# Patient Record
Sex: Male | Born: 1940 | Race: White | Hispanic: No | Marital: Married | Smoking: Never smoker
Health system: Southern US, Community
[De-identification: ages and names within clinical notes are randomized; demographics above are authoritative.]

## PROBLEM LIST (undated history)

## (undated) DIAGNOSIS — K219 Gastro-esophageal reflux disease without esophagitis: Secondary | ICD-10-CM

## (undated) DIAGNOSIS — F419 Anxiety disorder, unspecified: Secondary | ICD-10-CM

## (undated) DIAGNOSIS — E785 Hyperlipidemia, unspecified: Secondary | ICD-10-CM

---

## 2011-07-07 DIAGNOSIS — M5380 Other specified dorsopathies, site unspecified: Secondary | ICD-10-CM | POA: Diagnosis not present

## 2011-07-07 DIAGNOSIS — F411 Generalized anxiety disorder: Secondary | ICD-10-CM | POA: Diagnosis not present

## 2011-07-07 DIAGNOSIS — M47817 Spondylosis without myelopathy or radiculopathy, lumbosacral region: Secondary | ICD-10-CM | POA: Diagnosis not present

## 2011-07-07 DIAGNOSIS — Z79899 Other long term (current) drug therapy: Secondary | ICD-10-CM | POA: Diagnosis not present

## 2011-07-07 DIAGNOSIS — G8929 Other chronic pain: Secondary | ICD-10-CM | POA: Diagnosis not present

## 2011-07-07 DIAGNOSIS — E785 Hyperlipidemia, unspecified: Secondary | ICD-10-CM | POA: Diagnosis not present

## 2011-07-22 DIAGNOSIS — M47817 Spondylosis without myelopathy or radiculopathy, lumbosacral region: Secondary | ICD-10-CM | POA: Diagnosis not present

## 2011-07-22 DIAGNOSIS — M461 Sacroiliitis, not elsewhere classified: Secondary | ICD-10-CM | POA: Diagnosis not present

## 2011-07-22 DIAGNOSIS — M5380 Other specified dorsopathies, site unspecified: Secondary | ICD-10-CM | POA: Diagnosis not present

## 2011-07-22 DIAGNOSIS — M1990 Unspecified osteoarthritis, unspecified site: Secondary | ICD-10-CM | POA: Diagnosis not present

## 2011-07-30 DIAGNOSIS — F411 Generalized anxiety disorder: Secondary | ICD-10-CM | POA: Diagnosis not present

## 2011-07-30 DIAGNOSIS — M5380 Other specified dorsopathies, site unspecified: Secondary | ICD-10-CM | POA: Diagnosis not present

## 2011-07-30 DIAGNOSIS — M2559 Pain in other specified joint: Secondary | ICD-10-CM | POA: Diagnosis not present

## 2011-07-30 DIAGNOSIS — Z79899 Other long term (current) drug therapy: Secondary | ICD-10-CM | POA: Diagnosis not present

## 2011-07-30 DIAGNOSIS — M47817 Spondylosis without myelopathy or radiculopathy, lumbosacral region: Secondary | ICD-10-CM | POA: Diagnosis not present

## 2011-07-30 DIAGNOSIS — M1990 Unspecified osteoarthritis, unspecified site: Secondary | ICD-10-CM | POA: Diagnosis not present

## 2011-07-30 DIAGNOSIS — E785 Hyperlipidemia, unspecified: Secondary | ICD-10-CM | POA: Diagnosis not present

## 2011-07-30 DIAGNOSIS — M461 Sacroiliitis, not elsewhere classified: Secondary | ICD-10-CM | POA: Diagnosis not present

## 2011-08-21 DIAGNOSIS — M47817 Spondylosis without myelopathy or radiculopathy, lumbosacral region: Secondary | ICD-10-CM | POA: Diagnosis not present

## 2011-08-21 DIAGNOSIS — E785 Hyperlipidemia, unspecified: Secondary | ICD-10-CM | POA: Diagnosis not present

## 2011-08-21 DIAGNOSIS — Z79899 Other long term (current) drug therapy: Secondary | ICD-10-CM | POA: Diagnosis not present

## 2011-08-21 DIAGNOSIS — M5380 Other specified dorsopathies, site unspecified: Secondary | ICD-10-CM | POA: Diagnosis not present

## 2011-08-21 DIAGNOSIS — M461 Sacroiliitis, not elsewhere classified: Secondary | ICD-10-CM | POA: Diagnosis not present

## 2011-08-21 DIAGNOSIS — F411 Generalized anxiety disorder: Secondary | ICD-10-CM | POA: Diagnosis not present

## 2011-08-21 DIAGNOSIS — M1990 Unspecified osteoarthritis, unspecified site: Secondary | ICD-10-CM | POA: Diagnosis not present

## 2011-08-21 DIAGNOSIS — G8929 Other chronic pain: Secondary | ICD-10-CM | POA: Diagnosis not present

## 2011-08-31 DIAGNOSIS — F411 Generalized anxiety disorder: Secondary | ICD-10-CM | POA: Diagnosis not present

## 2011-09-10 DIAGNOSIS — M899 Disorder of bone, unspecified: Secondary | ICD-10-CM | POA: Diagnosis not present

## 2011-09-10 DIAGNOSIS — IMO0002 Reserved for concepts with insufficient information to code with codable children: Secondary | ICD-10-CM | POA: Diagnosis not present

## 2011-09-10 DIAGNOSIS — M25519 Pain in unspecified shoulder: Secondary | ICD-10-CM | POA: Diagnosis not present

## 2011-09-11 DIAGNOSIS — M1990 Unspecified osteoarthritis, unspecified site: Secondary | ICD-10-CM | POA: Diagnosis not present

## 2011-09-11 DIAGNOSIS — E7800 Pure hypercholesterolemia, unspecified: Secondary | ICD-10-CM | POA: Diagnosis not present

## 2011-09-14 DIAGNOSIS — M1990 Unspecified osteoarthritis, unspecified site: Secondary | ICD-10-CM | POA: Diagnosis not present

## 2011-09-14 DIAGNOSIS — M47817 Spondylosis without myelopathy or radiculopathy, lumbosacral region: Secondary | ICD-10-CM | POA: Diagnosis not present

## 2011-09-14 DIAGNOSIS — M5380 Other specified dorsopathies, site unspecified: Secondary | ICD-10-CM | POA: Diagnosis not present

## 2011-09-14 DIAGNOSIS — Z79899 Other long term (current) drug therapy: Secondary | ICD-10-CM | POA: Diagnosis not present

## 2011-09-17 DIAGNOSIS — M5137 Other intervertebral disc degeneration, lumbosacral region: Secondary | ICD-10-CM | POA: Diagnosis not present

## 2011-09-17 DIAGNOSIS — M47817 Spondylosis without myelopathy or radiculopathy, lumbosacral region: Secondary | ICD-10-CM | POA: Diagnosis not present

## 2011-09-17 DIAGNOSIS — M5126 Other intervertebral disc displacement, lumbar region: Secondary | ICD-10-CM | POA: Diagnosis not present

## 2011-10-05 DIAGNOSIS — F411 Generalized anxiety disorder: Secondary | ICD-10-CM | POA: Diagnosis not present

## 2011-10-05 DIAGNOSIS — K5730 Diverticulosis of large intestine without perforation or abscess without bleeding: Secondary | ICD-10-CM | POA: Diagnosis not present

## 2011-10-05 DIAGNOSIS — K219 Gastro-esophageal reflux disease without esophagitis: Secondary | ICD-10-CM | POA: Diagnosis not present

## 2011-10-05 DIAGNOSIS — F329 Major depressive disorder, single episode, unspecified: Secondary | ICD-10-CM | POA: Diagnosis not present

## 2011-10-05 DIAGNOSIS — Z1211 Encounter for screening for malignant neoplasm of colon: Secondary | ICD-10-CM | POA: Diagnosis not present

## 2011-10-05 DIAGNOSIS — D126 Benign neoplasm of colon, unspecified: Secondary | ICD-10-CM | POA: Diagnosis not present

## 2011-10-05 DIAGNOSIS — Z79899 Other long term (current) drug therapy: Secondary | ICD-10-CM | POA: Diagnosis not present

## 2011-10-05 DIAGNOSIS — K621 Rectal polyp: Secondary | ICD-10-CM | POA: Diagnosis not present

## 2011-10-05 DIAGNOSIS — E785 Hyperlipidemia, unspecified: Secondary | ICD-10-CM | POA: Diagnosis not present

## 2011-10-21 DIAGNOSIS — M47817 Spondylosis without myelopathy or radiculopathy, lumbosacral region: Secondary | ICD-10-CM | POA: Diagnosis not present

## 2011-10-21 DIAGNOSIS — M1990 Unspecified osteoarthritis, unspecified site: Secondary | ICD-10-CM | POA: Diagnosis not present

## 2011-10-21 DIAGNOSIS — M5380 Other specified dorsopathies, site unspecified: Secondary | ICD-10-CM | POA: Diagnosis not present

## 2011-10-21 DIAGNOSIS — Z79899 Other long term (current) drug therapy: Secondary | ICD-10-CM | POA: Diagnosis not present

## 2011-10-28 DIAGNOSIS — S70359A Superficial foreign body, unspecified thigh, initial encounter: Secondary | ICD-10-CM | POA: Diagnosis not present

## 2011-10-28 DIAGNOSIS — S90569A Insect bite (nonvenomous), unspecified ankle, initial encounter: Secondary | ICD-10-CM | POA: Diagnosis not present

## 2011-10-28 DIAGNOSIS — S90559A Superficial foreign body, unspecified ankle, initial encounter: Secondary | ICD-10-CM | POA: Diagnosis not present

## 2011-10-28 DIAGNOSIS — S80859A Superficial foreign body, unspecified lower leg, initial encounter: Secondary | ICD-10-CM | POA: Diagnosis not present

## 2011-10-28 DIAGNOSIS — S70259A Superficial foreign body, unspecified hip, initial encounter: Secondary | ICD-10-CM | POA: Diagnosis not present

## 2011-10-28 DIAGNOSIS — Z79899 Other long term (current) drug therapy: Secondary | ICD-10-CM | POA: Diagnosis not present

## 2011-12-10 DIAGNOSIS — IMO0001 Reserved for inherently not codable concepts without codable children: Secondary | ICD-10-CM | POA: Diagnosis not present

## 2011-12-10 DIAGNOSIS — M549 Dorsalgia, unspecified: Secondary | ICD-10-CM | POA: Diagnosis not present

## 2011-12-10 DIAGNOSIS — G8929 Other chronic pain: Secondary | ICD-10-CM | POA: Diagnosis not present

## 2011-12-10 DIAGNOSIS — M1990 Unspecified osteoarthritis, unspecified site: Secondary | ICD-10-CM | POA: Diagnosis not present

## 2011-12-10 DIAGNOSIS — E785 Hyperlipidemia, unspecified: Secondary | ICD-10-CM | POA: Diagnosis not present

## 2011-12-10 DIAGNOSIS — F411 Generalized anxiety disorder: Secondary | ICD-10-CM | POA: Diagnosis not present

## 2011-12-10 DIAGNOSIS — E7800 Pure hypercholesterolemia, unspecified: Secondary | ICD-10-CM | POA: Diagnosis not present

## 2011-12-14 DIAGNOSIS — I10 Essential (primary) hypertension: Secondary | ICD-10-CM | POA: Diagnosis not present

## 2011-12-14 DIAGNOSIS — E7800 Pure hypercholesterolemia, unspecified: Secondary | ICD-10-CM | POA: Diagnosis not present

## 2012-01-05 DIAGNOSIS — F411 Generalized anxiety disorder: Secondary | ICD-10-CM | POA: Diagnosis not present

## 2012-01-20 DIAGNOSIS — M5380 Other specified dorsopathies, site unspecified: Secondary | ICD-10-CM | POA: Diagnosis not present

## 2012-01-20 DIAGNOSIS — M1990 Unspecified osteoarthritis, unspecified site: Secondary | ICD-10-CM | POA: Diagnosis not present

## 2012-01-20 DIAGNOSIS — Z79899 Other long term (current) drug therapy: Secondary | ICD-10-CM | POA: Diagnosis not present

## 2012-01-20 DIAGNOSIS — M47817 Spondylosis without myelopathy or radiculopathy, lumbosacral region: Secondary | ICD-10-CM | POA: Diagnosis not present

## 2012-03-17 DIAGNOSIS — R109 Unspecified abdominal pain: Secondary | ICD-10-CM | POA: Diagnosis not present

## 2012-03-17 DIAGNOSIS — M549 Dorsalgia, unspecified: Secondary | ICD-10-CM | POA: Diagnosis not present

## 2012-04-20 DIAGNOSIS — M47817 Spondylosis without myelopathy or radiculopathy, lumbosacral region: Secondary | ICD-10-CM | POA: Diagnosis not present

## 2012-04-20 DIAGNOSIS — M1990 Unspecified osteoarthritis, unspecified site: Secondary | ICD-10-CM | POA: Diagnosis not present

## 2012-04-20 DIAGNOSIS — Z79899 Other long term (current) drug therapy: Secondary | ICD-10-CM | POA: Diagnosis not present

## 2012-04-20 DIAGNOSIS — M5380 Other specified dorsopathies, site unspecified: Secondary | ICD-10-CM | POA: Diagnosis not present

## 2012-05-06 DIAGNOSIS — G8929 Other chronic pain: Secondary | ICD-10-CM | POA: Diagnosis not present

## 2012-05-06 DIAGNOSIS — K219 Gastro-esophageal reflux disease without esophagitis: Secondary | ICD-10-CM | POA: Diagnosis not present

## 2012-05-06 DIAGNOSIS — F411 Generalized anxiety disorder: Secondary | ICD-10-CM | POA: Diagnosis not present

## 2012-05-06 DIAGNOSIS — Z886 Allergy status to analgesic agent status: Secondary | ICD-10-CM | POA: Diagnosis not present

## 2012-05-06 DIAGNOSIS — M1990 Unspecified osteoarthritis, unspecified site: Secondary | ICD-10-CM | POA: Diagnosis not present

## 2012-05-06 DIAGNOSIS — M5137 Other intervertebral disc degeneration, lumbosacral region: Secondary | ICD-10-CM | POA: Diagnosis not present

## 2012-05-06 DIAGNOSIS — E785 Hyperlipidemia, unspecified: Secondary | ICD-10-CM | POA: Diagnosis not present

## 2012-05-06 DIAGNOSIS — F17200 Nicotine dependence, unspecified, uncomplicated: Secondary | ICD-10-CM | POA: Diagnosis not present

## 2012-05-06 DIAGNOSIS — M461 Sacroiliitis, not elsewhere classified: Secondary | ICD-10-CM | POA: Diagnosis not present

## 2012-05-06 DIAGNOSIS — Z79899 Other long term (current) drug therapy: Secondary | ICD-10-CM | POA: Diagnosis not present

## 2012-05-06 DIAGNOSIS — Z8614 Personal history of Methicillin resistant Staphylococcus aureus infection: Secondary | ICD-10-CM | POA: Diagnosis not present

## 2012-05-06 DIAGNOSIS — Z882 Allergy status to sulfonamides status: Secondary | ICD-10-CM | POA: Diagnosis not present

## 2012-06-06 DIAGNOSIS — M5380 Other specified dorsopathies, site unspecified: Secondary | ICD-10-CM | POA: Diagnosis not present

## 2012-06-06 DIAGNOSIS — M47817 Spondylosis without myelopathy or radiculopathy, lumbosacral region: Secondary | ICD-10-CM | POA: Diagnosis not present

## 2012-06-06 DIAGNOSIS — Z79899 Other long term (current) drug therapy: Secondary | ICD-10-CM | POA: Diagnosis not present

## 2012-06-06 DIAGNOSIS — M1990 Unspecified osteoarthritis, unspecified site: Secondary | ICD-10-CM | POA: Diagnosis not present

## 2012-06-16 DIAGNOSIS — G8929 Other chronic pain: Secondary | ICD-10-CM | POA: Diagnosis not present

## 2012-06-16 DIAGNOSIS — F410 Panic disorder [episodic paroxysmal anxiety] without agoraphobia: Secondary | ICD-10-CM | POA: Diagnosis not present

## 2012-06-16 DIAGNOSIS — E7800 Pure hypercholesterolemia, unspecified: Secondary | ICD-10-CM | POA: Diagnosis not present

## 2012-06-24 DIAGNOSIS — Z5181 Encounter for therapeutic drug level monitoring: Secondary | ICD-10-CM | POA: Diagnosis not present

## 2012-06-24 DIAGNOSIS — Z79899 Other long term (current) drug therapy: Secondary | ICD-10-CM | POA: Diagnosis not present

## 2012-07-12 DIAGNOSIS — E7800 Pure hypercholesterolemia, unspecified: Secondary | ICD-10-CM | POA: Diagnosis not present

## 2012-07-12 DIAGNOSIS — R7309 Other abnormal glucose: Secondary | ICD-10-CM | POA: Diagnosis not present

## 2012-07-12 DIAGNOSIS — Z125 Encounter for screening for malignant neoplasm of prostate: Secondary | ICD-10-CM | POA: Diagnosis not present

## 2012-08-12 DIAGNOSIS — H2510 Age-related nuclear cataract, unspecified eye: Secondary | ICD-10-CM | POA: Diagnosis not present

## 2012-08-17 DIAGNOSIS — F411 Generalized anxiety disorder: Secondary | ICD-10-CM | POA: Diagnosis not present

## 2012-08-18 DIAGNOSIS — E7800 Pure hypercholesterolemia, unspecified: Secondary | ICD-10-CM | POA: Diagnosis not present

## 2012-08-18 DIAGNOSIS — F410 Panic disorder [episodic paroxysmal anxiety] without agoraphobia: Secondary | ICD-10-CM | POA: Diagnosis not present

## 2012-09-08 DIAGNOSIS — M5380 Other specified dorsopathies, site unspecified: Secondary | ICD-10-CM | POA: Diagnosis not present

## 2012-09-08 DIAGNOSIS — M25519 Pain in unspecified shoulder: Secondary | ICD-10-CM | POA: Diagnosis not present

## 2012-09-08 DIAGNOSIS — M1990 Unspecified osteoarthritis, unspecified site: Secondary | ICD-10-CM | POA: Diagnosis not present

## 2012-09-08 DIAGNOSIS — M47817 Spondylosis without myelopathy or radiculopathy, lumbosacral region: Secondary | ICD-10-CM | POA: Diagnosis not present

## 2012-09-23 DIAGNOSIS — M461 Sacroiliitis, not elsewhere classified: Secondary | ICD-10-CM | POA: Diagnosis not present

## 2012-09-23 DIAGNOSIS — M5380 Other specified dorsopathies, site unspecified: Secondary | ICD-10-CM | POA: Diagnosis not present

## 2012-09-23 DIAGNOSIS — M5137 Other intervertebral disc degeneration, lumbosacral region: Secondary | ICD-10-CM | POA: Diagnosis not present

## 2012-09-23 DIAGNOSIS — Z79899 Other long term (current) drug therapy: Secondary | ICD-10-CM | POA: Diagnosis not present

## 2012-09-23 DIAGNOSIS — Z886 Allergy status to analgesic agent status: Secondary | ICD-10-CM | POA: Diagnosis not present

## 2012-09-23 DIAGNOSIS — F411 Generalized anxiety disorder: Secondary | ICD-10-CM | POA: Diagnosis not present

## 2012-09-23 DIAGNOSIS — G8929 Other chronic pain: Secondary | ICD-10-CM | POA: Diagnosis not present

## 2012-09-23 DIAGNOSIS — M1990 Unspecified osteoarthritis, unspecified site: Secondary | ICD-10-CM | POA: Diagnosis not present

## 2012-09-23 DIAGNOSIS — E785 Hyperlipidemia, unspecified: Secondary | ICD-10-CM | POA: Diagnosis not present

## 2012-09-23 DIAGNOSIS — M25519 Pain in unspecified shoulder: Secondary | ICD-10-CM | POA: Diagnosis not present

## 2012-09-23 DIAGNOSIS — K219 Gastro-esophageal reflux disease without esophagitis: Secondary | ICD-10-CM | POA: Diagnosis not present

## 2012-09-23 DIAGNOSIS — IMO0002 Reserved for concepts with insufficient information to code with codable children: Secondary | ICD-10-CM | POA: Diagnosis not present

## 2012-09-23 DIAGNOSIS — M47817 Spondylosis without myelopathy or radiculopathy, lumbosacral region: Secondary | ICD-10-CM | POA: Diagnosis not present

## 2012-09-23 DIAGNOSIS — Z882 Allergy status to sulfonamides status: Secondary | ICD-10-CM | POA: Diagnosis not present

## 2012-10-28 DIAGNOSIS — F411 Generalized anxiety disorder: Secondary | ICD-10-CM | POA: Diagnosis not present

## 2012-11-02 DIAGNOSIS — M5380 Other specified dorsopathies, site unspecified: Secondary | ICD-10-CM | POA: Diagnosis not present

## 2012-11-02 DIAGNOSIS — Z79899 Other long term (current) drug therapy: Secondary | ICD-10-CM | POA: Diagnosis not present

## 2012-11-02 DIAGNOSIS — M47817 Spondylosis without myelopathy or radiculopathy, lumbosacral region: Secondary | ICD-10-CM | POA: Diagnosis not present

## 2012-11-02 DIAGNOSIS — M1990 Unspecified osteoarthritis, unspecified site: Secondary | ICD-10-CM | POA: Diagnosis not present

## 2012-11-24 DIAGNOSIS — I10 Essential (primary) hypertension: Secondary | ICD-10-CM | POA: Diagnosis not present

## 2012-12-09 DIAGNOSIS — F411 Generalized anxiety disorder: Secondary | ICD-10-CM | POA: Diagnosis not present

## 2012-12-09 DIAGNOSIS — I10 Essential (primary) hypertension: Secondary | ICD-10-CM | POA: Diagnosis not present

## 2012-12-09 DIAGNOSIS — E785 Hyperlipidemia, unspecified: Secondary | ICD-10-CM | POA: Diagnosis not present

## 2012-12-09 DIAGNOSIS — Z125 Encounter for screening for malignant neoplasm of prostate: Secondary | ICD-10-CM | POA: Diagnosis not present

## 2013-01-12 DIAGNOSIS — F410 Panic disorder [episodic paroxysmal anxiety] without agoraphobia: Secondary | ICD-10-CM | POA: Diagnosis not present

## 2013-02-10 DIAGNOSIS — H2510 Age-related nuclear cataract, unspecified eye: Secondary | ICD-10-CM | POA: Diagnosis not present

## 2013-03-01 DIAGNOSIS — M1990 Unspecified osteoarthritis, unspecified site: Secondary | ICD-10-CM | POA: Diagnosis not present

## 2013-03-01 DIAGNOSIS — M47817 Spondylosis without myelopathy or radiculopathy, lumbosacral region: Secondary | ICD-10-CM | POA: Diagnosis not present

## 2013-03-01 DIAGNOSIS — Z79899 Other long term (current) drug therapy: Secondary | ICD-10-CM | POA: Diagnosis not present

## 2013-03-01 DIAGNOSIS — M5380 Other specified dorsopathies, site unspecified: Secondary | ICD-10-CM | POA: Diagnosis not present

## 2013-04-06 DIAGNOSIS — F410 Panic disorder [episodic paroxysmal anxiety] without agoraphobia: Secondary | ICD-10-CM | POA: Diagnosis not present

## 2013-04-06 DIAGNOSIS — F458 Other somatoform disorders: Secondary | ICD-10-CM | POA: Diagnosis not present

## 2013-05-03 DIAGNOSIS — M549 Dorsalgia, unspecified: Secondary | ICD-10-CM | POA: Diagnosis not present

## 2013-05-03 DIAGNOSIS — E7800 Pure hypercholesterolemia, unspecified: Secondary | ICD-10-CM | POA: Diagnosis not present

## 2013-06-12 DIAGNOSIS — I10 Essential (primary) hypertension: Secondary | ICD-10-CM | POA: Diagnosis not present

## 2013-06-12 DIAGNOSIS — M549 Dorsalgia, unspecified: Secondary | ICD-10-CM | POA: Diagnosis not present

## 2013-06-12 DIAGNOSIS — E7800 Pure hypercholesterolemia, unspecified: Secondary | ICD-10-CM | POA: Diagnosis not present

## 2013-06-20 DIAGNOSIS — M47817 Spondylosis without myelopathy or radiculopathy, lumbosacral region: Secondary | ICD-10-CM | POA: Diagnosis not present

## 2013-06-20 DIAGNOSIS — M1990 Unspecified osteoarthritis, unspecified site: Secondary | ICD-10-CM | POA: Diagnosis not present

## 2013-06-20 DIAGNOSIS — M5380 Other specified dorsopathies, site unspecified: Secondary | ICD-10-CM | POA: Diagnosis not present

## 2013-06-20 DIAGNOSIS — Z79899 Other long term (current) drug therapy: Secondary | ICD-10-CM | POA: Diagnosis not present

## 2013-08-09 DIAGNOSIS — E7800 Pure hypercholesterolemia, unspecified: Secondary | ICD-10-CM | POA: Diagnosis not present

## 2013-08-09 DIAGNOSIS — M549 Dorsalgia, unspecified: Secondary | ICD-10-CM | POA: Diagnosis not present

## 2013-08-22 DIAGNOSIS — H2510 Age-related nuclear cataract, unspecified eye: Secondary | ICD-10-CM | POA: Diagnosis not present

## 2013-09-21 DIAGNOSIS — F410 Panic disorder [episodic paroxysmal anxiety] without agoraphobia: Secondary | ICD-10-CM | POA: Diagnosis not present

## 2013-10-19 DIAGNOSIS — H2510 Age-related nuclear cataract, unspecified eye: Secondary | ICD-10-CM | POA: Diagnosis not present

## 2013-10-19 DIAGNOSIS — H538 Other visual disturbances: Secondary | ICD-10-CM | POA: Diagnosis not present

## 2013-11-07 DIAGNOSIS — M47817 Spondylosis without myelopathy or radiculopathy, lumbosacral region: Secondary | ICD-10-CM | POA: Diagnosis not present

## 2013-11-07 DIAGNOSIS — M1990 Unspecified osteoarthritis, unspecified site: Secondary | ICD-10-CM | POA: Diagnosis not present

## 2013-11-07 DIAGNOSIS — M2559 Pain in other specified joint: Secondary | ICD-10-CM | POA: Diagnosis not present

## 2013-11-07 DIAGNOSIS — M461 Sacroiliitis, not elsewhere classified: Secondary | ICD-10-CM | POA: Diagnosis not present

## 2014-01-24 DIAGNOSIS — H2510 Age-related nuclear cataract, unspecified eye: Secondary | ICD-10-CM | POA: Diagnosis not present

## 2014-02-20 DIAGNOSIS — G8929 Other chronic pain: Secondary | ICD-10-CM | POA: Diagnosis not present

## 2014-02-20 DIAGNOSIS — I10 Essential (primary) hypertension: Secondary | ICD-10-CM | POA: Diagnosis not present

## 2014-02-20 DIAGNOSIS — Z125 Encounter for screening for malignant neoplasm of prostate: Secondary | ICD-10-CM | POA: Diagnosis not present

## 2014-02-20 DIAGNOSIS — Z23 Encounter for immunization: Secondary | ICD-10-CM | POA: Diagnosis not present

## 2014-02-20 DIAGNOSIS — E7800 Pure hypercholesterolemia, unspecified: Secondary | ICD-10-CM | POA: Diagnosis not present

## 2014-02-20 DIAGNOSIS — M549 Dorsalgia, unspecified: Secondary | ICD-10-CM | POA: Diagnosis not present

## 2014-03-12 DIAGNOSIS — Z8614 Personal history of Methicillin resistant Staphylococcus aureus infection: Secondary | ICD-10-CM | POA: Diagnosis not present

## 2014-03-12 DIAGNOSIS — L03211 Cellulitis of face: Secondary | ICD-10-CM | POA: Diagnosis not present

## 2014-03-12 DIAGNOSIS — L0201 Cutaneous abscess of face: Secondary | ICD-10-CM | POA: Diagnosis not present

## 2014-03-12 DIAGNOSIS — Z79899 Other long term (current) drug therapy: Secondary | ICD-10-CM | POA: Diagnosis not present

## 2014-03-21 DIAGNOSIS — M1990 Unspecified osteoarthritis, unspecified site: Secondary | ICD-10-CM | POA: Diagnosis not present

## 2014-03-21 DIAGNOSIS — M2559 Pain in other specified joint: Secondary | ICD-10-CM | POA: Diagnosis not present

## 2014-03-21 DIAGNOSIS — Z79899 Other long term (current) drug therapy: Secondary | ICD-10-CM | POA: Diagnosis not present

## 2014-03-21 DIAGNOSIS — M47817 Spondylosis without myelopathy or radiculopathy, lumbosacral region: Secondary | ICD-10-CM | POA: Diagnosis not present

## 2014-03-22 DIAGNOSIS — F410 Panic disorder [episodic paroxysmal anxiety] without agoraphobia: Secondary | ICD-10-CM | POA: Diagnosis not present

## 2014-05-14 DIAGNOSIS — G8929 Other chronic pain: Secondary | ICD-10-CM | POA: Diagnosis not present

## 2014-05-14 DIAGNOSIS — M1990 Unspecified osteoarthritis, unspecified site: Secondary | ICD-10-CM | POA: Diagnosis not present

## 2014-05-14 DIAGNOSIS — Z882 Allergy status to sulfonamides status: Secondary | ICD-10-CM | POA: Diagnosis not present

## 2014-05-14 DIAGNOSIS — E785 Hyperlipidemia, unspecified: Secondary | ICD-10-CM | POA: Diagnosis not present

## 2014-05-14 DIAGNOSIS — F17290 Nicotine dependence, other tobacco product, uncomplicated: Secondary | ICD-10-CM | POA: Diagnosis not present

## 2014-05-14 DIAGNOSIS — Z886 Allergy status to analgesic agent status: Secondary | ICD-10-CM | POA: Diagnosis not present

## 2014-05-14 DIAGNOSIS — M5408 Panniculitis affecting regions of neck and back, sacral and sacrococcygeal region: Secondary | ICD-10-CM | POA: Diagnosis not present

## 2014-05-14 DIAGNOSIS — Z79899 Other long term (current) drug therapy: Secondary | ICD-10-CM | POA: Diagnosis not present

## 2014-05-14 DIAGNOSIS — M5136 Other intervertebral disc degeneration, lumbar region: Secondary | ICD-10-CM | POA: Diagnosis not present

## 2014-05-14 DIAGNOSIS — M25511 Pain in right shoulder: Secondary | ICD-10-CM | POA: Diagnosis not present

## 2014-05-14 DIAGNOSIS — M47816 Spondylosis without myelopathy or radiculopathy, lumbar region: Secondary | ICD-10-CM | POA: Diagnosis not present

## 2014-05-14 DIAGNOSIS — M545 Low back pain: Secondary | ICD-10-CM | POA: Diagnosis not present

## 2014-05-14 DIAGNOSIS — M461 Sacroiliitis, not elsewhere classified: Secondary | ICD-10-CM | POA: Diagnosis not present

## 2014-05-14 DIAGNOSIS — F419 Anxiety disorder, unspecified: Secondary | ICD-10-CM | POA: Diagnosis not present

## 2014-05-14 DIAGNOSIS — M19019 Primary osteoarthritis, unspecified shoulder: Secondary | ICD-10-CM | POA: Diagnosis not present

## 2014-05-14 DIAGNOSIS — K219 Gastro-esophageal reflux disease without esophagitis: Secondary | ICD-10-CM | POA: Diagnosis not present

## 2014-05-23 DIAGNOSIS — G8929 Other chronic pain: Secondary | ICD-10-CM | POA: Diagnosis not present

## 2014-05-23 DIAGNOSIS — F5221 Male erectile disorder: Secondary | ICD-10-CM | POA: Diagnosis not present

## 2014-05-23 DIAGNOSIS — E785 Hyperlipidemia, unspecified: Secondary | ICD-10-CM | POA: Diagnosis not present

## 2014-05-23 DIAGNOSIS — F419 Anxiety disorder, unspecified: Secondary | ICD-10-CM | POA: Diagnosis not present

## 2014-06-13 DIAGNOSIS — M25511 Pain in right shoulder: Secondary | ICD-10-CM | POA: Diagnosis not present

## 2014-06-13 DIAGNOSIS — S1095XA Superficial foreign body of unspecified part of neck, initial encounter: Secondary | ICD-10-CM | POA: Diagnosis not present

## 2014-06-13 DIAGNOSIS — M47816 Spondylosis without myelopathy or radiculopathy, lumbar region: Secondary | ICD-10-CM | POA: Diagnosis not present

## 2014-06-13 DIAGNOSIS — M5030 Other cervical disc degeneration, unspecified cervical region: Secondary | ICD-10-CM | POA: Diagnosis not present

## 2014-06-13 DIAGNOSIS — M545 Low back pain: Secondary | ICD-10-CM | POA: Diagnosis not present

## 2014-06-13 DIAGNOSIS — M1990 Unspecified osteoarthritis, unspecified site: Secondary | ICD-10-CM | POA: Diagnosis not present

## 2014-06-13 DIAGNOSIS — S1015XA Superficial foreign body of throat, initial encounter: Secondary | ICD-10-CM | POA: Diagnosis not present

## 2014-07-11 DIAGNOSIS — M795 Residual foreign body in soft tissue: Secondary | ICD-10-CM | POA: Diagnosis not present

## 2014-07-19 DIAGNOSIS — M795 Residual foreign body in soft tissue: Secondary | ICD-10-CM | POA: Diagnosis not present

## 2014-07-19 DIAGNOSIS — S1095XA Superficial foreign body of unspecified part of neck, initial encounter: Secondary | ICD-10-CM | POA: Diagnosis not present

## 2014-08-01 DIAGNOSIS — M75101 Unspecified rotator cuff tear or rupture of right shoulder, not specified as traumatic: Secondary | ICD-10-CM | POA: Diagnosis not present

## 2014-08-01 DIAGNOSIS — M7521 Bicipital tendinitis, right shoulder: Secondary | ICD-10-CM | POA: Diagnosis not present

## 2014-08-01 DIAGNOSIS — M47816 Spondylosis without myelopathy or radiculopathy, lumbar region: Secondary | ICD-10-CM | POA: Diagnosis not present

## 2014-08-01 DIAGNOSIS — M5126 Other intervertebral disc displacement, lumbar region: Secondary | ICD-10-CM | POA: Diagnosis not present

## 2014-08-01 DIAGNOSIS — M7581 Other shoulder lesions, right shoulder: Secondary | ICD-10-CM | POA: Diagnosis not present

## 2014-08-01 DIAGNOSIS — M75121 Complete rotator cuff tear or rupture of right shoulder, not specified as traumatic: Secondary | ICD-10-CM | POA: Diagnosis not present

## 2014-08-15 DIAGNOSIS — M75101 Unspecified rotator cuff tear or rupture of right shoulder, not specified as traumatic: Secondary | ICD-10-CM | POA: Diagnosis not present

## 2014-08-15 DIAGNOSIS — M47816 Spondylosis without myelopathy or radiculopathy, lumbar region: Secondary | ICD-10-CM | POA: Diagnosis not present

## 2014-08-15 DIAGNOSIS — M545 Low back pain: Secondary | ICD-10-CM | POA: Diagnosis not present

## 2014-08-15 DIAGNOSIS — M25511 Pain in right shoulder: Secondary | ICD-10-CM | POA: Diagnosis not present

## 2014-09-05 DIAGNOSIS — M25511 Pain in right shoulder: Secondary | ICD-10-CM | POA: Diagnosis not present

## 2014-09-05 DIAGNOSIS — G8929 Other chronic pain: Secondary | ICD-10-CM | POA: Diagnosis not present

## 2014-09-05 DIAGNOSIS — M549 Dorsalgia, unspecified: Secondary | ICD-10-CM | POA: Diagnosis not present

## 2014-09-05 DIAGNOSIS — E785 Hyperlipidemia, unspecified: Secondary | ICD-10-CM | POA: Diagnosis not present

## 2014-09-12 DIAGNOSIS — M7522 Bicipital tendinitis, left shoulder: Secondary | ICD-10-CM | POA: Diagnosis not present

## 2014-09-12 DIAGNOSIS — M7521 Bicipital tendinitis, right shoulder: Secondary | ICD-10-CM | POA: Diagnosis not present

## 2014-09-12 DIAGNOSIS — M25511 Pain in right shoulder: Secondary | ICD-10-CM | POA: Diagnosis not present

## 2014-09-12 DIAGNOSIS — M75121 Complete rotator cuff tear or rupture of right shoulder, not specified as traumatic: Secondary | ICD-10-CM | POA: Diagnosis not present

## 2014-10-15 DIAGNOSIS — F458 Other somatoform disorders: Secondary | ICD-10-CM | POA: Diagnosis not present

## 2014-10-15 DIAGNOSIS — F411 Generalized anxiety disorder: Secondary | ICD-10-CM | POA: Diagnosis not present

## 2014-10-15 DIAGNOSIS — F410 Panic disorder [episodic paroxysmal anxiety] without agoraphobia: Secondary | ICD-10-CM | POA: Diagnosis not present

## 2014-12-03 DIAGNOSIS — H2513 Age-related nuclear cataract, bilateral: Secondary | ICD-10-CM | POA: Diagnosis not present

## 2014-12-17 DIAGNOSIS — F411 Generalized anxiety disorder: Secondary | ICD-10-CM | POA: Diagnosis not present

## 2014-12-19 DIAGNOSIS — M549 Dorsalgia, unspecified: Secondary | ICD-10-CM | POA: Diagnosis not present

## 2014-12-19 DIAGNOSIS — E784 Other hyperlipidemia: Secondary | ICD-10-CM | POA: Diagnosis not present

## 2014-12-19 DIAGNOSIS — E780 Pure hypercholesterolemia: Secondary | ICD-10-CM | POA: Diagnosis not present

## 2014-12-19 DIAGNOSIS — G8929 Other chronic pain: Secondary | ICD-10-CM | POA: Diagnosis not present

## 2014-12-19 DIAGNOSIS — F419 Anxiety disorder, unspecified: Secondary | ICD-10-CM | POA: Diagnosis not present

## 2014-12-19 DIAGNOSIS — R7309 Other abnormal glucose: Secondary | ICD-10-CM | POA: Diagnosis not present

## 2014-12-19 DIAGNOSIS — I10 Essential (primary) hypertension: Secondary | ICD-10-CM | POA: Diagnosis not present

## 2014-12-26 DIAGNOSIS — H538 Other visual disturbances: Secondary | ICD-10-CM | POA: Diagnosis not present

## 2014-12-26 DIAGNOSIS — H2511 Age-related nuclear cataract, right eye: Secondary | ICD-10-CM | POA: Diagnosis not present

## 2015-01-07 DIAGNOSIS — F419 Anxiety disorder, unspecified: Secondary | ICD-10-CM | POA: Diagnosis not present

## 2015-01-07 DIAGNOSIS — R12 Heartburn: Secondary | ICD-10-CM | POA: Diagnosis not present

## 2015-01-07 DIAGNOSIS — H5200 Hypermetropia, unspecified eye: Secondary | ICD-10-CM | POA: Diagnosis not present

## 2015-01-07 DIAGNOSIS — M2550 Pain in unspecified joint: Secondary | ICD-10-CM | POA: Diagnosis not present

## 2015-01-07 DIAGNOSIS — H52209 Unspecified astigmatism, unspecified eye: Secondary | ICD-10-CM | POA: Diagnosis not present

## 2015-01-07 DIAGNOSIS — Z7951 Long term (current) use of inhaled steroids: Secondary | ICD-10-CM | POA: Diagnosis not present

## 2015-01-07 DIAGNOSIS — H2513 Age-related nuclear cataract, bilateral: Secondary | ICD-10-CM | POA: Diagnosis not present

## 2015-01-07 DIAGNOSIS — F329 Major depressive disorder, single episode, unspecified: Secondary | ICD-10-CM | POA: Diagnosis not present

## 2015-01-07 DIAGNOSIS — E785 Hyperlipidemia, unspecified: Secondary | ICD-10-CM | POA: Diagnosis not present

## 2015-01-07 DIAGNOSIS — Z79899 Other long term (current) drug therapy: Secondary | ICD-10-CM | POA: Diagnosis not present

## 2015-01-07 DIAGNOSIS — H259 Unspecified age-related cataract: Secondary | ICD-10-CM | POA: Diagnosis not present

## 2015-01-07 DIAGNOSIS — H524 Presbyopia: Secondary | ICD-10-CM | POA: Diagnosis not present

## 2015-01-07 DIAGNOSIS — H2511 Age-related nuclear cataract, right eye: Secondary | ICD-10-CM | POA: Diagnosis not present

## 2015-01-07 DIAGNOSIS — H538 Other visual disturbances: Secondary | ICD-10-CM | POA: Diagnosis not present

## 2015-02-28 DIAGNOSIS — M1990 Unspecified osteoarthritis, unspecified site: Secondary | ICD-10-CM | POA: Diagnosis not present

## 2015-02-28 DIAGNOSIS — M47816 Spondylosis without myelopathy or radiculopathy, lumbar region: Secondary | ICD-10-CM | POA: Diagnosis not present

## 2015-02-28 DIAGNOSIS — M5136 Other intervertebral disc degeneration, lumbar region: Secondary | ICD-10-CM | POA: Diagnosis not present

## 2015-02-28 DIAGNOSIS — M545 Low back pain: Secondary | ICD-10-CM | POA: Diagnosis not present

## 2015-03-08 DIAGNOSIS — E785 Hyperlipidemia, unspecified: Secondary | ICD-10-CM | POA: Diagnosis not present

## 2015-03-08 DIAGNOSIS — G8929 Other chronic pain: Secondary | ICD-10-CM | POA: Diagnosis not present

## 2015-03-08 DIAGNOSIS — E784 Other hyperlipidemia: Secondary | ICD-10-CM | POA: Diagnosis not present

## 2015-03-08 DIAGNOSIS — F419 Anxiety disorder, unspecified: Secondary | ICD-10-CM | POA: Diagnosis not present

## 2015-03-08 DIAGNOSIS — E780 Pure hypercholesterolemia: Secondary | ICD-10-CM | POA: Diagnosis not present

## 2015-03-08 DIAGNOSIS — M549 Dorsalgia, unspecified: Secondary | ICD-10-CM | POA: Diagnosis not present

## 2015-04-08 DIAGNOSIS — H538 Other visual disturbances: Secondary | ICD-10-CM | POA: Diagnosis not present

## 2015-04-08 DIAGNOSIS — R12 Heartburn: Secondary | ICD-10-CM | POA: Diagnosis not present

## 2015-04-08 DIAGNOSIS — K219 Gastro-esophageal reflux disease without esophagitis: Secondary | ICD-10-CM | POA: Diagnosis not present

## 2015-04-08 DIAGNOSIS — Z882 Allergy status to sulfonamides status: Secondary | ICD-10-CM | POA: Diagnosis not present

## 2015-04-08 DIAGNOSIS — Z7951 Long term (current) use of inhaled steroids: Secondary | ICD-10-CM | POA: Diagnosis not present

## 2015-04-08 DIAGNOSIS — F329 Major depressive disorder, single episode, unspecified: Secondary | ICD-10-CM | POA: Diagnosis not present

## 2015-04-08 DIAGNOSIS — H269 Unspecified cataract: Secondary | ICD-10-CM | POA: Diagnosis not present

## 2015-04-08 DIAGNOSIS — H5200 Hypermetropia, unspecified eye: Secondary | ICD-10-CM | POA: Diagnosis not present

## 2015-04-08 DIAGNOSIS — Z79899 Other long term (current) drug therapy: Secondary | ICD-10-CM | POA: Diagnosis not present

## 2015-04-08 DIAGNOSIS — Z886 Allergy status to analgesic agent status: Secondary | ICD-10-CM | POA: Diagnosis not present

## 2015-04-08 DIAGNOSIS — E785 Hyperlipidemia, unspecified: Secondary | ICD-10-CM | POA: Diagnosis not present

## 2015-04-08 DIAGNOSIS — H524 Presbyopia: Secondary | ICD-10-CM | POA: Diagnosis not present

## 2015-04-08 DIAGNOSIS — Z961 Presence of intraocular lens: Secondary | ICD-10-CM | POA: Diagnosis not present

## 2015-04-08 DIAGNOSIS — H52209 Unspecified astigmatism, unspecified eye: Secondary | ICD-10-CM | POA: Diagnosis not present

## 2015-04-08 DIAGNOSIS — H2512 Age-related nuclear cataract, left eye: Secondary | ICD-10-CM | POA: Diagnosis not present

## 2015-04-08 DIAGNOSIS — F419 Anxiety disorder, unspecified: Secondary | ICD-10-CM | POA: Diagnosis not present

## 2015-04-22 DIAGNOSIS — Z79891 Long term (current) use of opiate analgesic: Secondary | ICD-10-CM | POA: Diagnosis not present

## 2015-04-26 DIAGNOSIS — M5136 Other intervertebral disc degeneration, lumbar region: Secondary | ICD-10-CM | POA: Diagnosis not present

## 2015-04-26 DIAGNOSIS — Z79899 Other long term (current) drug therapy: Secondary | ICD-10-CM | POA: Diagnosis not present

## 2015-04-26 DIAGNOSIS — F419 Anxiety disorder, unspecified: Secondary | ICD-10-CM | POA: Diagnosis not present

## 2015-04-26 DIAGNOSIS — Z882 Allergy status to sulfonamides status: Secondary | ICD-10-CM | POA: Diagnosis not present

## 2015-04-26 DIAGNOSIS — M25511 Pain in right shoulder: Secondary | ICD-10-CM | POA: Diagnosis not present

## 2015-04-26 DIAGNOSIS — M545 Low back pain: Secondary | ICD-10-CM | POA: Diagnosis not present

## 2015-04-26 DIAGNOSIS — G8929 Other chronic pain: Secondary | ICD-10-CM | POA: Diagnosis not present

## 2015-04-26 DIAGNOSIS — M47816 Spondylosis without myelopathy or radiculopathy, lumbar region: Secondary | ICD-10-CM | POA: Diagnosis not present

## 2015-04-26 DIAGNOSIS — M1990 Unspecified osteoarthritis, unspecified site: Secondary | ICD-10-CM | POA: Diagnosis not present

## 2015-04-26 DIAGNOSIS — E785 Hyperlipidemia, unspecified: Secondary | ICD-10-CM | POA: Diagnosis not present

## 2015-04-26 DIAGNOSIS — Z7951 Long term (current) use of inhaled steroids: Secondary | ICD-10-CM | POA: Diagnosis not present

## 2015-04-26 DIAGNOSIS — Z886 Allergy status to analgesic agent status: Secondary | ICD-10-CM | POA: Diagnosis not present

## 2015-04-26 DIAGNOSIS — K219 Gastro-esophageal reflux disease without esophagitis: Secondary | ICD-10-CM | POA: Diagnosis not present

## 2015-05-06 DIAGNOSIS — F411 Generalized anxiety disorder: Secondary | ICD-10-CM | POA: Diagnosis not present

## 2015-05-10 DIAGNOSIS — F411 Generalized anxiety disorder: Secondary | ICD-10-CM | POA: Diagnosis not present

## 2015-06-21 DIAGNOSIS — F419 Anxiety disorder, unspecified: Secondary | ICD-10-CM | POA: Diagnosis not present

## 2015-06-21 DIAGNOSIS — E784 Other hyperlipidemia: Secondary | ICD-10-CM | POA: Diagnosis not present

## 2015-06-21 DIAGNOSIS — M549 Dorsalgia, unspecified: Secondary | ICD-10-CM | POA: Diagnosis not present

## 2015-06-28 DIAGNOSIS — M1990 Unspecified osteoarthritis, unspecified site: Secondary | ICD-10-CM | POA: Diagnosis not present

## 2015-06-28 DIAGNOSIS — M47816 Spondylosis without myelopathy or radiculopathy, lumbar region: Secondary | ICD-10-CM | POA: Diagnosis not present

## 2015-06-28 DIAGNOSIS — M545 Low back pain: Secondary | ICD-10-CM | POA: Diagnosis not present

## 2015-06-28 DIAGNOSIS — M5136 Other intervertebral disc degeneration, lumbar region: Secondary | ICD-10-CM | POA: Diagnosis not present

## 2015-07-10 DIAGNOSIS — Z23 Encounter for immunization: Secondary | ICD-10-CM | POA: Diagnosis not present

## 2015-08-30 DIAGNOSIS — E1165 Type 2 diabetes mellitus with hyperglycemia: Secondary | ICD-10-CM | POA: Diagnosis not present

## 2015-08-30 DIAGNOSIS — F419 Anxiety disorder, unspecified: Secondary | ICD-10-CM | POA: Diagnosis not present

## 2015-08-30 DIAGNOSIS — M549 Dorsalgia, unspecified: Secondary | ICD-10-CM | POA: Diagnosis not present

## 2015-08-30 DIAGNOSIS — E784 Other hyperlipidemia: Secondary | ICD-10-CM | POA: Diagnosis not present

## 2015-09-04 DIAGNOSIS — Z961 Presence of intraocular lens: Secondary | ICD-10-CM | POA: Diagnosis not present

## 2015-09-04 DIAGNOSIS — H538 Other visual disturbances: Secondary | ICD-10-CM | POA: Diagnosis not present

## 2015-09-04 DIAGNOSIS — H26492 Other secondary cataract, left eye: Secondary | ICD-10-CM | POA: Diagnosis not present

## 2015-09-12 DIAGNOSIS — E784 Other hyperlipidemia: Secondary | ICD-10-CM | POA: Diagnosis not present

## 2015-09-12 DIAGNOSIS — F419 Anxiety disorder, unspecified: Secondary | ICD-10-CM | POA: Diagnosis not present

## 2015-09-12 DIAGNOSIS — M549 Dorsalgia, unspecified: Secondary | ICD-10-CM | POA: Diagnosis not present

## 2015-11-01 DIAGNOSIS — F410 Panic disorder [episodic paroxysmal anxiety] without agoraphobia: Secondary | ICD-10-CM | POA: Diagnosis not present

## 2015-11-01 DIAGNOSIS — Z79899 Other long term (current) drug therapy: Secondary | ICD-10-CM | POA: Diagnosis not present

## 2015-11-20 DIAGNOSIS — M25512 Pain in left shoulder: Secondary | ICD-10-CM | POA: Diagnosis not present

## 2015-11-20 DIAGNOSIS — Z79899 Other long term (current) drug therapy: Secondary | ICD-10-CM | POA: Diagnosis not present

## 2015-11-20 DIAGNOSIS — K219 Gastro-esophageal reflux disease without esophagitis: Secondary | ICD-10-CM | POA: Diagnosis not present

## 2015-11-20 DIAGNOSIS — J302 Other seasonal allergic rhinitis: Secondary | ICD-10-CM | POA: Diagnosis not present

## 2015-11-30 DIAGNOSIS — S199XXA Unspecified injury of neck, initial encounter: Secondary | ICD-10-CM | POA: Diagnosis not present

## 2015-11-30 DIAGNOSIS — S4991XA Unspecified injury of right shoulder and upper arm, initial encounter: Secondary | ICD-10-CM | POA: Diagnosis not present

## 2015-11-30 DIAGNOSIS — M25511 Pain in right shoulder: Secondary | ICD-10-CM | POA: Diagnosis not present

## 2015-11-30 DIAGNOSIS — S3992XA Unspecified injury of lower back, initial encounter: Secondary | ICD-10-CM | POA: Diagnosis not present

## 2015-12-02 ENCOUNTER — Encounter (HOSPITAL_COMMUNITY): Payer: Self-pay

## 2015-12-02 ENCOUNTER — Ambulatory Visit (HOSPITAL_COMMUNITY)
Admission: RE | Admit: 2015-12-02 | Discharge: 2015-12-02 | Disposition: A | Discharge status: Home / Self Care | Payer: Medicare Other | Source: Ambulatory Visit | Attending: Ophthalmology | Admitting: Ophthalmology

## 2015-12-02 ENCOUNTER — Encounter (HOSPITAL_COMMUNITY)
Admission: RE | Disposition: A | Discharge status: Home / Self Care | Payer: Self-pay | Source: Ambulatory Visit | Attending: Ophthalmology

## 2015-12-02 DIAGNOSIS — H26492 Other secondary cataract, left eye: Secondary | ICD-10-CM | POA: Diagnosis present

## 2015-12-02 HISTORY — DX: Gastro-esophageal reflux disease without esophagitis: K21.9

## 2015-12-02 HISTORY — PX: YAG LASER APPLICATION: SHX6189

## 2015-12-02 HISTORY — DX: Anxiety disorder, unspecified: F41.9

## 2015-12-02 HISTORY — DX: Hyperlipidemia, unspecified: E78.5

## 2015-12-02 SURGERY — TREATMENT, USING YAG LASER
Anesthesia: LOCAL | Laterality: Left

## 2015-12-02 MED ORDER — TETRACAINE HCL 0.5 % OP SOLN
1.0000 [drp] | OPHTHALMIC | Status: AC
  Administered 2015-12-02 (×3): 1 [drp] via OPHTHALMIC

## 2015-12-02 MED ORDER — TROPICAMIDE 1 % OP SOLN
OPHTHALMIC | Status: AC
  Filled 2015-12-02: qty 3

## 2015-12-02 MED ORDER — TETRACAINE HCL 0.5 % OP SOLN: 1.0000 [drp] | OPHTHALMIC | Status: DC

## 2015-12-02 MED ORDER — TROPICAMIDE 1 % OP SOLN
1.0000 [drp] | OPHTHALMIC | Status: AC
  Administered 2015-12-02 (×3): 1 [drp] via OPHTHALMIC

## 2015-12-02 MED ORDER — TETRACAINE HCL 0.5 % OP SOLN
OPHTHALMIC | Status: AC
  Filled 2015-12-02: qty 4

## 2015-12-02 NOTE — Discharge Instructions (Signed)
Lee Williamson  12/02/2015     Instructions    Activity: No Restrictions.   Diet: Resume Diet you were on at home.   Pain Medication: Tylenol if Needed.   CONTACT YOUR DOCTOR IF YOU HAVE PAIN, REDNESS IN YOUR EYE, OR DECREASED VISION.   Follow-up:Tuesdaty June 27th @2 :45 pm with Williams Che, MD.     Dr. Iona Hansen: [phone]    If you find that you cannot contact your physician, but feel that your signs and   Symptoms warrant a physician's attention, call the Emergency Room at   [phone] [phone].

## 2015-12-02 NOTE — Brief Op Note (Signed)
Lee Williamson 12/02/2015  Williams Che, MD  Pre-op Diagnosis:  secondary cataract left eye  Post-op Diagnosis: secondary cataract OS  Yag laser self-test completed: Yes.    Indications:  See scanned office H&P for detailed indications  Procedure:   YAG posterior capsulotomy OS  Eye protection worn by staff:  Yes.   Laser In Use sign on door:  Yes.    Laser: {LUMENIS YAG/SLT LASER  Power Setting:  1.7 mJ/burst Anatomical site treated:  Posterior capsule OS Number of applications:  18 Total energy delivered: 28.74 mJ Results:  Clear visual axis OS  Patient verbalizes understanding of discharge instructions:  Yes.    Notes:   Pt tolerated procedure well without complications.

## 2015-12-02 NOTE — H&P (Signed)
I have reviewed the pre printed H&P, the patient was re-examined, and I have identified no significant interval changes in the patient's medical condition.  There is no change in the plan of care since the history and physical of record. 

## 2015-12-04 ENCOUNTER — Encounter (HOSPITAL_COMMUNITY): Payer: Self-pay | Admitting: Ophthalmology

## 2015-12-04 DIAGNOSIS — M549 Dorsalgia, unspecified: Secondary | ICD-10-CM | POA: Diagnosis not present

## 2015-12-04 DIAGNOSIS — M542 Cervicalgia: Secondary | ICD-10-CM | POA: Diagnosis not present

## 2015-12-04 DIAGNOSIS — M25511 Pain in right shoulder: Secondary | ICD-10-CM | POA: Diagnosis not present

## 2015-12-11 DIAGNOSIS — M542 Cervicalgia: Secondary | ICD-10-CM | POA: Diagnosis not present

## 2015-12-11 DIAGNOSIS — M25512 Pain in left shoulder: Secondary | ICD-10-CM | POA: Diagnosis not present

## 2015-12-13 DIAGNOSIS — M542 Cervicalgia: Secondary | ICD-10-CM | POA: Diagnosis not present

## 2015-12-13 DIAGNOSIS — M25512 Pain in left shoulder: Secondary | ICD-10-CM | POA: Diagnosis not present

## 2015-12-17 DIAGNOSIS — M542 Cervicalgia: Secondary | ICD-10-CM | POA: Diagnosis not present

## 2015-12-17 DIAGNOSIS — M25512 Pain in left shoulder: Secondary | ICD-10-CM | POA: Diagnosis not present

## 2015-12-20 DIAGNOSIS — M542 Cervicalgia: Secondary | ICD-10-CM | POA: Diagnosis not present

## 2015-12-20 DIAGNOSIS — M25512 Pain in left shoulder: Secondary | ICD-10-CM | POA: Diagnosis not present

## 2015-12-24 DIAGNOSIS — M542 Cervicalgia: Secondary | ICD-10-CM | POA: Diagnosis not present

## 2015-12-24 DIAGNOSIS — M25512 Pain in left shoulder: Secondary | ICD-10-CM | POA: Diagnosis not present

## 2015-12-25 DIAGNOSIS — M542 Cervicalgia: Secondary | ICD-10-CM | POA: Diagnosis not present

## 2015-12-25 DIAGNOSIS — M25512 Pain in left shoulder: Secondary | ICD-10-CM | POA: Diagnosis not present

## 2015-12-30 DIAGNOSIS — M25512 Pain in left shoulder: Secondary | ICD-10-CM | POA: Diagnosis not present

## 2015-12-30 DIAGNOSIS — M542 Cervicalgia: Secondary | ICD-10-CM | POA: Diagnosis not present

## 2016-01-02 DIAGNOSIS — M542 Cervicalgia: Secondary | ICD-10-CM | POA: Diagnosis not present

## 2016-01-02 DIAGNOSIS — M25512 Pain in left shoulder: Secondary | ICD-10-CM | POA: Diagnosis not present

## 2016-01-03 DIAGNOSIS — M549 Dorsalgia, unspecified: Secondary | ICD-10-CM | POA: Diagnosis not present

## 2016-01-03 DIAGNOSIS — M542 Cervicalgia: Secondary | ICD-10-CM | POA: Diagnosis not present

## 2016-01-03 DIAGNOSIS — M25512 Pain in left shoulder: Secondary | ICD-10-CM | POA: Diagnosis not present

## 2016-01-07 DIAGNOSIS — M25512 Pain in left shoulder: Secondary | ICD-10-CM | POA: Diagnosis not present

## 2016-01-07 DIAGNOSIS — M542 Cervicalgia: Secondary | ICD-10-CM | POA: Diagnosis not present

## 2016-01-09 DIAGNOSIS — K5903 Drug induced constipation: Secondary | ICD-10-CM | POA: Diagnosis not present

## 2016-01-09 DIAGNOSIS — G894 Chronic pain syndrome: Secondary | ICD-10-CM | POA: Diagnosis not present

## 2016-01-09 DIAGNOSIS — M25512 Pain in left shoulder: Secondary | ICD-10-CM | POA: Diagnosis not present

## 2016-01-09 DIAGNOSIS — E782 Mixed hyperlipidemia: Secondary | ICD-10-CM | POA: Diagnosis not present

## 2016-01-09 DIAGNOSIS — M5136 Other intervertebral disc degeneration, lumbar region: Secondary | ICD-10-CM | POA: Diagnosis not present

## 2016-01-09 DIAGNOSIS — M542 Cervicalgia: Secondary | ICD-10-CM | POA: Diagnosis not present

## 2016-01-14 DIAGNOSIS — M25512 Pain in left shoulder: Secondary | ICD-10-CM | POA: Diagnosis not present

## 2016-01-14 DIAGNOSIS — M542 Cervicalgia: Secondary | ICD-10-CM | POA: Diagnosis not present

## 2016-01-17 DIAGNOSIS — M25512 Pain in left shoulder: Secondary | ICD-10-CM | POA: Diagnosis not present

## 2016-01-17 DIAGNOSIS — M542 Cervicalgia: Secondary | ICD-10-CM | POA: Diagnosis not present

## 2016-01-22 DIAGNOSIS — M25512 Pain in left shoulder: Secondary | ICD-10-CM | POA: Diagnosis not present

## 2016-01-29 DIAGNOSIS — M66322 Spontaneous rupture of flexor tendons, left upper arm: Secondary | ICD-10-CM | POA: Diagnosis not present

## 2016-01-29 DIAGNOSIS — M25512 Pain in left shoulder: Secondary | ICD-10-CM | POA: Diagnosis not present

## 2016-02-25 DIAGNOSIS — M2550 Pain in unspecified joint: Secondary | ICD-10-CM | POA: Diagnosis not present

## 2016-02-25 DIAGNOSIS — M47817 Spondylosis without myelopathy or radiculopathy, lumbosacral region: Secondary | ICD-10-CM | POA: Diagnosis not present

## 2016-02-25 DIAGNOSIS — Z79891 Long term (current) use of opiate analgesic: Secondary | ICD-10-CM | POA: Diagnosis not present

## 2016-02-25 DIAGNOSIS — Z79899 Other long term (current) drug therapy: Secondary | ICD-10-CM | POA: Diagnosis not present

## 2016-02-25 DIAGNOSIS — M5137 Other intervertebral disc degeneration, lumbosacral region: Secondary | ICD-10-CM | POA: Diagnosis not present

## 2016-02-25 DIAGNOSIS — G8929 Other chronic pain: Secondary | ICD-10-CM | POA: Diagnosis not present

## 2016-02-25 DIAGNOSIS — M25512 Pain in left shoulder: Secondary | ICD-10-CM | POA: Diagnosis not present

## 2016-02-25 DIAGNOSIS — M25511 Pain in right shoulder: Secondary | ICD-10-CM | POA: Diagnosis not present

## 2016-04-20 DIAGNOSIS — M47817 Spondylosis without myelopathy or radiculopathy, lumbosacral region: Secondary | ICD-10-CM | POA: Diagnosis not present

## 2016-04-20 DIAGNOSIS — G8929 Other chronic pain: Secondary | ICD-10-CM | POA: Diagnosis not present

## 2016-04-20 DIAGNOSIS — Z79891 Long term (current) use of opiate analgesic: Secondary | ICD-10-CM | POA: Diagnosis not present

## 2016-04-20 DIAGNOSIS — M5137 Other intervertebral disc degeneration, lumbosacral region: Secondary | ICD-10-CM | POA: Diagnosis not present

## 2016-04-22 DIAGNOSIS — M549 Dorsalgia, unspecified: Secondary | ICD-10-CM | POA: Diagnosis not present

## 2016-04-22 DIAGNOSIS — G8929 Other chronic pain: Secondary | ICD-10-CM | POA: Diagnosis not present

## 2016-04-22 DIAGNOSIS — F419 Anxiety disorder, unspecified: Secondary | ICD-10-CM | POA: Diagnosis not present

## 2016-04-22 DIAGNOSIS — E784 Other hyperlipidemia: Secondary | ICD-10-CM | POA: Diagnosis not present

## 2016-04-23 DIAGNOSIS — M549 Dorsalgia, unspecified: Secondary | ICD-10-CM | POA: Diagnosis not present

## 2016-04-23 DIAGNOSIS — E784 Other hyperlipidemia: Secondary | ICD-10-CM | POA: Diagnosis not present

## 2016-04-23 DIAGNOSIS — G8929 Other chronic pain: Secondary | ICD-10-CM | POA: Diagnosis not present

## 2016-05-04 DIAGNOSIS — Z79899 Other long term (current) drug therapy: Secondary | ICD-10-CM | POA: Diagnosis not present

## 2016-05-04 DIAGNOSIS — F410 Panic disorder [episodic paroxysmal anxiety] without agoraphobia: Secondary | ICD-10-CM | POA: Diagnosis not present

## 2016-05-13 DIAGNOSIS — E784 Other hyperlipidemia: Secondary | ICD-10-CM | POA: Diagnosis not present

## 2016-05-13 DIAGNOSIS — G8929 Other chronic pain: Secondary | ICD-10-CM | POA: Diagnosis not present

## 2016-05-13 DIAGNOSIS — M549 Dorsalgia, unspecified: Secondary | ICD-10-CM | POA: Diagnosis not present

## 2016-05-13 DIAGNOSIS — F419 Anxiety disorder, unspecified: Secondary | ICD-10-CM | POA: Diagnosis not present

## 2016-06-17 DIAGNOSIS — Z79891 Long term (current) use of opiate analgesic: Secondary | ICD-10-CM | POA: Diagnosis not present

## 2016-06-17 DIAGNOSIS — G8929 Other chronic pain: Secondary | ICD-10-CM | POA: Diagnosis not present

## 2016-06-17 DIAGNOSIS — M47817 Spondylosis without myelopathy or radiculopathy, lumbosacral region: Secondary | ICD-10-CM | POA: Diagnosis not present

## 2016-06-17 DIAGNOSIS — M5137 Other intervertebral disc degeneration, lumbosacral region: Secondary | ICD-10-CM | POA: Diagnosis not present

## 2016-07-07 DIAGNOSIS — W01198A Fall on same level from slipping, tripping and stumbling with subsequent striking against other object, initial encounter: Secondary | ICD-10-CM | POA: Diagnosis not present

## 2016-07-07 DIAGNOSIS — S2241XA Multiple fractures of ribs, right side, initial encounter for closed fracture: Secondary | ICD-10-CM | POA: Diagnosis not present

## 2016-07-07 DIAGNOSIS — M25511 Pain in right shoulder: Secondary | ICD-10-CM | POA: Diagnosis not present

## 2016-07-07 DIAGNOSIS — Z7951 Long term (current) use of inhaled steroids: Secondary | ICD-10-CM | POA: Diagnosis not present

## 2016-07-07 DIAGNOSIS — Z79899 Other long term (current) drug therapy: Secondary | ICD-10-CM | POA: Diagnosis not present

## 2016-07-07 DIAGNOSIS — K219 Gastro-esophageal reflux disease without esophagitis: Secondary | ICD-10-CM | POA: Diagnosis not present

## 2016-07-24 ENCOUNTER — Other Ambulatory Visit (HOSPITAL_COMMUNITY): Payer: Self-pay | Admitting: Internal Medicine

## 2016-07-24 DIAGNOSIS — S2231XB Fracture of one rib, right side, initial encounter for open fracture: Secondary | ICD-10-CM | POA: Diagnosis not present

## 2016-07-24 DIAGNOSIS — E784 Other hyperlipidemia: Secondary | ICD-10-CM | POA: Diagnosis not present

## 2016-07-24 DIAGNOSIS — S2241XB Multiple fractures of ribs, right side, initial encounter for open fracture: Secondary | ICD-10-CM

## 2016-07-29 ENCOUNTER — Other Ambulatory Visit (HOSPITAL_COMMUNITY): Payer: Medicare Other

## 2016-08-03 ENCOUNTER — Ambulatory Visit (HOSPITAL_COMMUNITY)
Admission: RE | Admit: 2016-08-03 | Discharge: 2016-08-03 | Disposition: A | Discharge status: Home / Self Care | Payer: Medicare Other | Source: Ambulatory Visit | Attending: Internal Medicine | Admitting: Internal Medicine

## 2016-08-03 DIAGNOSIS — S2231XB Fracture of one rib, right side, initial encounter for open fracture: Secondary | ICD-10-CM | POA: Insufficient documentation

## 2016-08-03 DIAGNOSIS — X58XXXA Exposure to other specified factors, initial encounter: Secondary | ICD-10-CM | POA: Insufficient documentation

## 2016-08-03 DIAGNOSIS — M8589 Other specified disorders of bone density and structure, multiple sites: Secondary | ICD-10-CM | POA: Diagnosis not present

## 2016-08-03 DIAGNOSIS — S2241XB Multiple fractures of ribs, right side, initial encounter for open fracture: Secondary | ICD-10-CM

## 2016-08-03 DIAGNOSIS — M85852 Other specified disorders of bone density and structure, left thigh: Secondary | ICD-10-CM | POA: Diagnosis not present

## 2016-09-21 DIAGNOSIS — M47817 Spondylosis without myelopathy or radiculopathy, lumbosacral region: Secondary | ICD-10-CM | POA: Diagnosis not present

## 2016-09-21 DIAGNOSIS — Z79891 Long term (current) use of opiate analgesic: Secondary | ICD-10-CM | POA: Diagnosis not present

## 2016-09-21 DIAGNOSIS — G8929 Other chronic pain: Secondary | ICD-10-CM | POA: Diagnosis not present

## 2016-09-21 DIAGNOSIS — M2550 Pain in unspecified joint: Secondary | ICD-10-CM | POA: Diagnosis not present

## 2016-09-28 DIAGNOSIS — E784 Other hyperlipidemia: Secondary | ICD-10-CM | POA: Diagnosis not present

## 2016-09-28 DIAGNOSIS — F419 Anxiety disorder, unspecified: Secondary | ICD-10-CM | POA: Diagnosis not present

## 2016-09-28 DIAGNOSIS — M549 Dorsalgia, unspecified: Secondary | ICD-10-CM | POA: Diagnosis not present

## 2016-10-27 DIAGNOSIS — F410 Panic disorder [episodic paroxysmal anxiety] without agoraphobia: Secondary | ICD-10-CM | POA: Diagnosis not present

## 2016-10-27 DIAGNOSIS — Z79899 Other long term (current) drug therapy: Secondary | ICD-10-CM | POA: Diagnosis not present

## 2016-11-24 DIAGNOSIS — M47817 Spondylosis without myelopathy or radiculopathy, lumbosacral region: Secondary | ICD-10-CM | POA: Diagnosis not present

## 2016-12-16 DIAGNOSIS — Z79891 Long term (current) use of opiate analgesic: Secondary | ICD-10-CM | POA: Diagnosis not present

## 2016-12-16 DIAGNOSIS — G8929 Other chronic pain: Secondary | ICD-10-CM | POA: Diagnosis not present

## 2016-12-16 DIAGNOSIS — M47817 Spondylosis without myelopathy or radiculopathy, lumbosacral region: Secondary | ICD-10-CM | POA: Diagnosis not present

## 2016-12-16 DIAGNOSIS — M2550 Pain in unspecified joint: Secondary | ICD-10-CM | POA: Diagnosis not present

## 2017-01-01 DIAGNOSIS — E784 Other hyperlipidemia: Secondary | ICD-10-CM | POA: Diagnosis not present

## 2017-01-01 DIAGNOSIS — F419 Anxiety disorder, unspecified: Secondary | ICD-10-CM | POA: Diagnosis not present

## 2017-01-01 DIAGNOSIS — M549 Dorsalgia, unspecified: Secondary | ICD-10-CM | POA: Diagnosis not present

## 2017-01-01 DIAGNOSIS — G8929 Other chronic pain: Secondary | ICD-10-CM | POA: Diagnosis not present

## 2017-01-05 DIAGNOSIS — F419 Anxiety disorder, unspecified: Secondary | ICD-10-CM | POA: Diagnosis not present

## 2017-01-05 DIAGNOSIS — M549 Dorsalgia, unspecified: Secondary | ICD-10-CM | POA: Diagnosis not present

## 2017-01-05 DIAGNOSIS — E784 Other hyperlipidemia: Secondary | ICD-10-CM | POA: Diagnosis not present

## 2017-03-02 DIAGNOSIS — H524 Presbyopia: Secondary | ICD-10-CM | POA: Diagnosis not present

## 2017-03-02 DIAGNOSIS — H43392 Other vitreous opacities, left eye: Secondary | ICD-10-CM | POA: Diagnosis not present

## 2017-03-23 DIAGNOSIS — G894 Chronic pain syndrome: Secondary | ICD-10-CM | POA: Diagnosis not present

## 2017-03-23 DIAGNOSIS — Z79891 Long term (current) use of opiate analgesic: Secondary | ICD-10-CM | POA: Diagnosis not present

## 2017-03-23 DIAGNOSIS — M545 Low back pain: Secondary | ICD-10-CM | POA: Diagnosis not present

## 2017-03-23 DIAGNOSIS — M47816 Spondylosis without myelopathy or radiculopathy, lumbar region: Secondary | ICD-10-CM | POA: Diagnosis not present

## 2017-03-23 DIAGNOSIS — Z79899 Other long term (current) drug therapy: Secondary | ICD-10-CM | POA: Diagnosis not present

## 2017-04-01 DIAGNOSIS — E7849 Other hyperlipidemia: Secondary | ICD-10-CM | POA: Diagnosis not present

## 2017-04-01 DIAGNOSIS — G8929 Other chronic pain: Secondary | ICD-10-CM | POA: Diagnosis not present

## 2017-04-01 DIAGNOSIS — M549 Dorsalgia, unspecified: Secondary | ICD-10-CM | POA: Diagnosis not present

## 2017-04-01 DIAGNOSIS — F419 Anxiety disorder, unspecified: Secondary | ICD-10-CM | POA: Diagnosis not present

## 2017-04-14 DIAGNOSIS — F419 Anxiety disorder, unspecified: Secondary | ICD-10-CM | POA: Diagnosis not present

## 2017-04-14 DIAGNOSIS — G8929 Other chronic pain: Secondary | ICD-10-CM | POA: Diagnosis not present

## 2017-04-14 DIAGNOSIS — E7849 Other hyperlipidemia: Secondary | ICD-10-CM | POA: Diagnosis not present

## 2017-04-14 DIAGNOSIS — M549 Dorsalgia, unspecified: Secondary | ICD-10-CM | POA: Diagnosis not present

## 2017-04-23 DIAGNOSIS — Z79891 Long term (current) use of opiate analgesic: Secondary | ICD-10-CM | POA: Diagnosis not present

## 2017-04-23 DIAGNOSIS — M545 Low back pain: Secondary | ICD-10-CM | POA: Diagnosis not present

## 2017-04-23 DIAGNOSIS — M47896 Other spondylosis, lumbar region: Secondary | ICD-10-CM | POA: Diagnosis not present

## 2017-04-23 DIAGNOSIS — G894 Chronic pain syndrome: Secondary | ICD-10-CM | POA: Diagnosis not present

## 2017-04-23 DIAGNOSIS — M47816 Spondylosis without myelopathy or radiculopathy, lumbar region: Secondary | ICD-10-CM | POA: Diagnosis not present

## 2017-04-23 DIAGNOSIS — M5136 Other intervertebral disc degeneration, lumbar region: Secondary | ICD-10-CM | POA: Diagnosis not present

## 2017-04-26 DIAGNOSIS — F410 Panic disorder [episodic paroxysmal anxiety] without agoraphobia: Secondary | ICD-10-CM | POA: Diagnosis not present

## 2017-04-26 DIAGNOSIS — Z79899 Other long term (current) drug therapy: Secondary | ICD-10-CM | POA: Diagnosis not present

## 2017-06-09 DIAGNOSIS — Z79891 Long term (current) use of opiate analgesic: Secondary | ICD-10-CM | POA: Diagnosis not present

## 2017-06-09 DIAGNOSIS — G894 Chronic pain syndrome: Secondary | ICD-10-CM | POA: Diagnosis not present

## 2017-06-09 DIAGNOSIS — M5136 Other intervertebral disc degeneration, lumbar region: Secondary | ICD-10-CM | POA: Diagnosis not present

## 2017-06-09 DIAGNOSIS — M47816 Spondylosis without myelopathy or radiculopathy, lumbar region: Secondary | ICD-10-CM | POA: Diagnosis not present

## 2017-06-09 DIAGNOSIS — M2550 Pain in unspecified joint: Secondary | ICD-10-CM | POA: Diagnosis not present

## 2017-06-09 DIAGNOSIS — M545 Low back pain: Secondary | ICD-10-CM | POA: Diagnosis not present

## 2017-07-08 DIAGNOSIS — G8929 Other chronic pain: Secondary | ICD-10-CM | POA: Diagnosis not present

## 2017-07-08 DIAGNOSIS — E7849 Other hyperlipidemia: Secondary | ICD-10-CM | POA: Diagnosis not present

## 2017-07-08 DIAGNOSIS — M549 Dorsalgia, unspecified: Secondary | ICD-10-CM | POA: Diagnosis not present

## 2017-07-08 DIAGNOSIS — E785 Hyperlipidemia, unspecified: Secondary | ICD-10-CM | POA: Diagnosis not present

## 2017-07-08 DIAGNOSIS — F419 Anxiety disorder, unspecified: Secondary | ICD-10-CM | POA: Diagnosis not present

## 2017-09-07 DIAGNOSIS — G894 Chronic pain syndrome: Secondary | ICD-10-CM | POA: Diagnosis not present

## 2017-09-07 DIAGNOSIS — M47816 Spondylosis without myelopathy or radiculopathy, lumbar region: Secondary | ICD-10-CM | POA: Diagnosis not present

## 2017-09-07 DIAGNOSIS — M5136 Other intervertebral disc degeneration, lumbar region: Secondary | ICD-10-CM | POA: Diagnosis not present

## 2017-09-07 DIAGNOSIS — M545 Low back pain: Secondary | ICD-10-CM | POA: Diagnosis not present

## 2017-10-14 DIAGNOSIS — M8580 Other specified disorders of bone density and structure, unspecified site: Secondary | ICD-10-CM | POA: Diagnosis not present

## 2017-10-22 DIAGNOSIS — F419 Anxiety disorder, unspecified: Secondary | ICD-10-CM | POA: Diagnosis not present

## 2017-10-22 DIAGNOSIS — E7849 Other hyperlipidemia: Secondary | ICD-10-CM | POA: Diagnosis not present

## 2017-10-26 DIAGNOSIS — Z79899 Other long term (current) drug therapy: Secondary | ICD-10-CM | POA: Diagnosis not present

## 2017-10-26 DIAGNOSIS — F410 Panic disorder [episodic paroxysmal anxiety] without agoraphobia: Secondary | ICD-10-CM | POA: Diagnosis not present

## 2017-12-08 DIAGNOSIS — G894 Chronic pain syndrome: Secondary | ICD-10-CM | POA: Diagnosis not present

## 2017-12-08 DIAGNOSIS — M2550 Pain in unspecified joint: Secondary | ICD-10-CM | POA: Diagnosis not present

## 2017-12-08 DIAGNOSIS — Z79891 Long term (current) use of opiate analgesic: Secondary | ICD-10-CM | POA: Diagnosis not present

## 2017-12-08 DIAGNOSIS — M47816 Spondylosis without myelopathy or radiculopathy, lumbar region: Secondary | ICD-10-CM | POA: Diagnosis not present

## 2017-12-08 DIAGNOSIS — M545 Low back pain: Secondary | ICD-10-CM | POA: Diagnosis not present

## 2017-12-08 DIAGNOSIS — M5136 Other intervertebral disc degeneration, lumbar region: Secondary | ICD-10-CM | POA: Diagnosis not present

## 2018-01-21 DIAGNOSIS — M549 Dorsalgia, unspecified: Secondary | ICD-10-CM | POA: Diagnosis not present

## 2018-01-21 DIAGNOSIS — E7849 Other hyperlipidemia: Secondary | ICD-10-CM | POA: Diagnosis not present

## 2018-01-21 DIAGNOSIS — F419 Anxiety disorder, unspecified: Secondary | ICD-10-CM | POA: Diagnosis not present

## 2018-03-10 DIAGNOSIS — M5136 Other intervertebral disc degeneration, lumbar region: Secondary | ICD-10-CM | POA: Diagnosis not present

## 2018-03-10 DIAGNOSIS — M47816 Spondylosis without myelopathy or radiculopathy, lumbar region: Secondary | ICD-10-CM | POA: Diagnosis not present

## 2018-03-10 DIAGNOSIS — M545 Low back pain: Secondary | ICD-10-CM | POA: Diagnosis not present

## 2018-03-10 DIAGNOSIS — G894 Chronic pain syndrome: Secondary | ICD-10-CM | POA: Diagnosis not present

## 2018-03-10 DIAGNOSIS — M2550 Pain in unspecified joint: Secondary | ICD-10-CM | POA: Diagnosis not present

## 2018-04-25 DIAGNOSIS — F419 Anxiety disorder, unspecified: Secondary | ICD-10-CM | POA: Diagnosis not present

## 2018-04-25 DIAGNOSIS — Z1389 Encounter for screening for other disorder: Secondary | ICD-10-CM | POA: Diagnosis not present

## 2018-04-25 DIAGNOSIS — M8580 Other specified disorders of bone density and structure, unspecified site: Secondary | ICD-10-CM | POA: Diagnosis not present

## 2018-04-25 DIAGNOSIS — E7849 Other hyperlipidemia: Secondary | ICD-10-CM | POA: Diagnosis not present

## 2018-04-25 DIAGNOSIS — Z1331 Encounter for screening for depression: Secondary | ICD-10-CM | POA: Diagnosis not present

## 2018-04-25 DIAGNOSIS — M549 Dorsalgia, unspecified: Secondary | ICD-10-CM | POA: Diagnosis not present

## 2018-05-02 DIAGNOSIS — E785 Hyperlipidemia, unspecified: Secondary | ICD-10-CM | POA: Diagnosis not present

## 2018-05-02 DIAGNOSIS — E7849 Other hyperlipidemia: Secondary | ICD-10-CM | POA: Diagnosis not present

## 2018-05-02 DIAGNOSIS — G8929 Other chronic pain: Secondary | ICD-10-CM | POA: Diagnosis not present

## 2018-05-02 DIAGNOSIS — Z125 Encounter for screening for malignant neoplasm of prostate: Secondary | ICD-10-CM | POA: Diagnosis not present

## 2018-05-02 DIAGNOSIS — M549 Dorsalgia, unspecified: Secondary | ICD-10-CM | POA: Diagnosis not present

## 2018-05-02 DIAGNOSIS — F419 Anxiety disorder, unspecified: Secondary | ICD-10-CM | POA: Diagnosis not present

## 2018-05-19 ENCOUNTER — Other Ambulatory Visit: Payer: Self-pay

## 2018-05-23 DIAGNOSIS — Z79899 Other long term (current) drug therapy: Secondary | ICD-10-CM | POA: Diagnosis not present

## 2018-05-23 DIAGNOSIS — F410 Panic disorder [episodic paroxysmal anxiety] without agoraphobia: Secondary | ICD-10-CM | POA: Diagnosis not present

## 2018-06-09 DIAGNOSIS — G894 Chronic pain syndrome: Secondary | ICD-10-CM | POA: Diagnosis not present

## 2018-06-09 DIAGNOSIS — M545 Low back pain: Secondary | ICD-10-CM | POA: Diagnosis not present

## 2018-06-09 DIAGNOSIS — M47816 Spondylosis without myelopathy or radiculopathy, lumbar region: Secondary | ICD-10-CM | POA: Diagnosis not present

## 2018-06-09 DIAGNOSIS — M5136 Other intervertebral disc degeneration, lumbar region: Secondary | ICD-10-CM | POA: Diagnosis not present

## 2018-06-09 DIAGNOSIS — M2550 Pain in unspecified joint: Secondary | ICD-10-CM | POA: Diagnosis not present

## 2018-07-29 DIAGNOSIS — F419 Anxiety disorder, unspecified: Secondary | ICD-10-CM | POA: Diagnosis not present

## 2018-07-29 DIAGNOSIS — M8500 Fibrous dysplasia (monostotic), unspecified site: Secondary | ICD-10-CM | POA: Diagnosis not present

## 2018-07-29 DIAGNOSIS — M549 Dorsalgia, unspecified: Secondary | ICD-10-CM | POA: Diagnosis not present

## 2018-07-29 DIAGNOSIS — E7849 Other hyperlipidemia: Secondary | ICD-10-CM | POA: Diagnosis not present

## 2018-08-08 DIAGNOSIS — J208 Acute bronchitis due to other specified organisms: Secondary | ICD-10-CM | POA: Diagnosis not present

## 2018-08-08 DIAGNOSIS — B9689 Other specified bacterial agents as the cause of diseases classified elsewhere: Secondary | ICD-10-CM | POA: Diagnosis not present

## 2018-08-08 DIAGNOSIS — R0602 Shortness of breath: Secondary | ICD-10-CM | POA: Diagnosis not present

## 2018-08-08 DIAGNOSIS — F419 Anxiety disorder, unspecified: Secondary | ICD-10-CM | POA: Diagnosis not present

## 2018-08-08 DIAGNOSIS — K219 Gastro-esophageal reflux disease without esophagitis: Secondary | ICD-10-CM | POA: Diagnosis not present

## 2018-08-08 DIAGNOSIS — R05 Cough: Secondary | ICD-10-CM | POA: Diagnosis not present

## 2018-08-08 DIAGNOSIS — Z79899 Other long term (current) drug therapy: Secondary | ICD-10-CM | POA: Diagnosis not present

## 2018-08-08 DIAGNOSIS — F329 Major depressive disorder, single episode, unspecified: Secondary | ICD-10-CM | POA: Diagnosis not present

## 2018-08-08 DIAGNOSIS — J019 Acute sinusitis, unspecified: Secondary | ICD-10-CM | POA: Diagnosis not present

## 2018-08-18 DIAGNOSIS — F419 Anxiety disorder, unspecified: Secondary | ICD-10-CM | POA: Diagnosis not present

## 2018-08-18 DIAGNOSIS — E785 Hyperlipidemia, unspecified: Secondary | ICD-10-CM | POA: Diagnosis not present

## 2018-08-18 DIAGNOSIS — M549 Dorsalgia, unspecified: Secondary | ICD-10-CM | POA: Diagnosis not present

## 2018-09-29 DIAGNOSIS — M545 Low back pain: Secondary | ICD-10-CM | POA: Diagnosis not present

## 2018-09-29 DIAGNOSIS — G894 Chronic pain syndrome: Secondary | ICD-10-CM | POA: Diagnosis not present

## 2018-09-29 DIAGNOSIS — M2550 Pain in unspecified joint: Secondary | ICD-10-CM | POA: Diagnosis not present

## 2018-09-29 DIAGNOSIS — M47816 Spondylosis without myelopathy or radiculopathy, lumbar region: Secondary | ICD-10-CM | POA: Diagnosis not present

## 2019-01-13 DIAGNOSIS — M47816 Spondylosis without myelopathy or radiculopathy, lumbar region: Secondary | ICD-10-CM | POA: Diagnosis not present

## 2019-01-13 DIAGNOSIS — M5136 Other intervertebral disc degeneration, lumbar region: Secondary | ICD-10-CM | POA: Diagnosis not present

## 2019-01-13 DIAGNOSIS — M7918 Myalgia, other site: Secondary | ICD-10-CM | POA: Diagnosis not present

## 2019-01-13 DIAGNOSIS — M2550 Pain in unspecified joint: Secondary | ICD-10-CM | POA: Diagnosis not present

## 2019-01-13 DIAGNOSIS — G894 Chronic pain syndrome: Secondary | ICD-10-CM | POA: Diagnosis not present

## 2019-01-13 DIAGNOSIS — M545 Low back pain: Secondary | ICD-10-CM | POA: Diagnosis not present

## 2019-02-14 DIAGNOSIS — M549 Dorsalgia, unspecified: Secondary | ICD-10-CM | POA: Diagnosis not present

## 2019-02-14 DIAGNOSIS — E785 Hyperlipidemia, unspecified: Secondary | ICD-10-CM | POA: Diagnosis not present

## 2019-02-14 DIAGNOSIS — M159 Polyosteoarthritis, unspecified: Secondary | ICD-10-CM | POA: Diagnosis not present

## 2019-04-10 DIAGNOSIS — F419 Anxiety disorder, unspecified: Secondary | ICD-10-CM | POA: Diagnosis not present

## 2019-04-10 DIAGNOSIS — E785 Hyperlipidemia, unspecified: Secondary | ICD-10-CM | POA: Diagnosis not present

## 2019-04-10 DIAGNOSIS — Z1331 Encounter for screening for depression: Secondary | ICD-10-CM | POA: Diagnosis not present

## 2019-04-10 DIAGNOSIS — M549 Dorsalgia, unspecified: Secondary | ICD-10-CM | POA: Diagnosis not present

## 2019-04-10 DIAGNOSIS — K219 Gastro-esophageal reflux disease without esophagitis: Secondary | ICD-10-CM | POA: Diagnosis not present

## 2019-04-10 DIAGNOSIS — Z1389 Encounter for screening for other disorder: Secondary | ICD-10-CM | POA: Diagnosis not present

## 2019-04-27 DIAGNOSIS — I10 Essential (primary) hypertension: Secondary | ICD-10-CM | POA: Diagnosis not present

## 2019-04-27 DIAGNOSIS — E785 Hyperlipidemia, unspecified: Secondary | ICD-10-CM | POA: Diagnosis not present

## 2019-05-23 ENCOUNTER — Other Ambulatory Visit: Payer: Self-pay

## 2019-07-17 DIAGNOSIS — K219 Gastro-esophageal reflux disease without esophagitis: Secondary | ICD-10-CM | POA: Diagnosis not present

## 2019-07-17 DIAGNOSIS — M549 Dorsalgia, unspecified: Secondary | ICD-10-CM | POA: Diagnosis not present

## 2019-07-17 DIAGNOSIS — G8929 Other chronic pain: Secondary | ICD-10-CM | POA: Diagnosis not present

## 2019-07-17 DIAGNOSIS — E785 Hyperlipidemia, unspecified: Secondary | ICD-10-CM | POA: Diagnosis not present

## 2019-08-03 DIAGNOSIS — M2550 Pain in unspecified joint: Secondary | ICD-10-CM | POA: Diagnosis not present

## 2019-08-03 DIAGNOSIS — M545 Low back pain: Secondary | ICD-10-CM | POA: Diagnosis not present

## 2019-08-03 DIAGNOSIS — G894 Chronic pain syndrome: Secondary | ICD-10-CM | POA: Diagnosis not present

## 2019-08-03 DIAGNOSIS — M47816 Spondylosis without myelopathy or radiculopathy, lumbar region: Secondary | ICD-10-CM | POA: Diagnosis not present

## 2019-08-08 DIAGNOSIS — G894 Chronic pain syndrome: Secondary | ICD-10-CM | POA: Diagnosis not present

## 2019-08-17 DIAGNOSIS — M549 Dorsalgia, unspecified: Secondary | ICD-10-CM | POA: Diagnosis not present

## 2019-08-17 DIAGNOSIS — K219 Gastro-esophageal reflux disease without esophagitis: Secondary | ICD-10-CM | POA: Diagnosis not present

## 2019-09-14 DIAGNOSIS — K219 Gastro-esophageal reflux disease without esophagitis: Secondary | ICD-10-CM | POA: Diagnosis not present

## 2019-09-14 DIAGNOSIS — E785 Hyperlipidemia, unspecified: Secondary | ICD-10-CM | POA: Diagnosis not present

## 2019-10-15 DIAGNOSIS — E785 Hyperlipidemia, unspecified: Secondary | ICD-10-CM | POA: Diagnosis not present

## 2019-10-15 DIAGNOSIS — M549 Dorsalgia, unspecified: Secondary | ICD-10-CM | POA: Diagnosis not present

## 2019-10-15 DIAGNOSIS — K219 Gastro-esophageal reflux disease without esophagitis: Secondary | ICD-10-CM | POA: Diagnosis not present

## 2019-11-06 DIAGNOSIS — Z86010 Personal history of colonic polyps: Secondary | ICD-10-CM | POA: Diagnosis not present

## 2019-11-06 DIAGNOSIS — K219 Gastro-esophageal reflux disease without esophagitis: Secondary | ICD-10-CM | POA: Diagnosis not present

## 2019-11-06 DIAGNOSIS — M549 Dorsalgia, unspecified: Secondary | ICD-10-CM | POA: Diagnosis not present

## 2019-11-30 DIAGNOSIS — M2550 Pain in unspecified joint: Secondary | ICD-10-CM | POA: Diagnosis not present

## 2019-11-30 DIAGNOSIS — G894 Chronic pain syndrome: Secondary | ICD-10-CM | POA: Diagnosis not present

## 2019-11-30 DIAGNOSIS — M545 Low back pain: Secondary | ICD-10-CM | POA: Diagnosis not present

## 2019-11-30 DIAGNOSIS — M47816 Spondylosis without myelopathy or radiculopathy, lumbar region: Secondary | ICD-10-CM | POA: Diagnosis not present

## 2020-02-08 DIAGNOSIS — K219 Gastro-esophageal reflux disease without esophagitis: Secondary | ICD-10-CM | POA: Diagnosis not present

## 2020-02-08 DIAGNOSIS — E785 Hyperlipidemia, unspecified: Secondary | ICD-10-CM | POA: Diagnosis not present

## 2020-03-10 DIAGNOSIS — K219 Gastro-esophageal reflux disease without esophagitis: Secondary | ICD-10-CM | POA: Diagnosis not present

## 2020-03-10 DIAGNOSIS — E785 Hyperlipidemia, unspecified: Secondary | ICD-10-CM | POA: Diagnosis not present

## 2020-03-26 DIAGNOSIS — D125 Benign neoplasm of sigmoid colon: Secondary | ICD-10-CM | POA: Diagnosis not present

## 2020-04-09 DIAGNOSIS — E785 Hyperlipidemia, unspecified: Secondary | ICD-10-CM | POA: Diagnosis not present

## 2020-04-09 DIAGNOSIS — K219 Gastro-esophageal reflux disease without esophagitis: Secondary | ICD-10-CM | POA: Diagnosis not present

## 2020-04-11 DIAGNOSIS — G894 Chronic pain syndrome: Secondary | ICD-10-CM | POA: Diagnosis not present

## 2020-04-11 DIAGNOSIS — M47816 Spondylosis without myelopathy or radiculopathy, lumbar region: Secondary | ICD-10-CM | POA: Diagnosis not present

## 2020-04-11 DIAGNOSIS — M5459 Other low back pain: Secondary | ICD-10-CM | POA: Diagnosis not present

## 2020-04-11 DIAGNOSIS — Z79891 Long term (current) use of opiate analgesic: Secondary | ICD-10-CM | POA: Diagnosis not present

## 2020-04-18 DIAGNOSIS — I700 Atherosclerosis of aorta: Secondary | ICD-10-CM | POA: Diagnosis not present

## 2020-04-18 DIAGNOSIS — M47817 Spondylosis without myelopathy or radiculopathy, lumbosacral region: Secondary | ICD-10-CM | POA: Diagnosis not present

## 2020-04-18 DIAGNOSIS — M5459 Other low back pain: Secondary | ICD-10-CM | POA: Diagnosis not present

## 2020-04-18 DIAGNOSIS — M47816 Spondylosis without myelopathy or radiculopathy, lumbar region: Secondary | ICD-10-CM | POA: Diagnosis not present

## 2020-04-18 DIAGNOSIS — M4316 Spondylolisthesis, lumbar region: Secondary | ICD-10-CM | POA: Diagnosis not present

## 2020-05-06 DIAGNOSIS — Z01818 Encounter for other preprocedural examination: Secondary | ICD-10-CM | POA: Diagnosis not present

## 2020-05-08 DIAGNOSIS — F419 Anxiety disorder, unspecified: Secondary | ICD-10-CM | POA: Diagnosis not present

## 2020-05-08 DIAGNOSIS — E785 Hyperlipidemia, unspecified: Secondary | ICD-10-CM | POA: Diagnosis not present

## 2020-05-08 DIAGNOSIS — G8929 Other chronic pain: Secondary | ICD-10-CM | POA: Diagnosis not present

## 2020-05-08 DIAGNOSIS — Z1389 Encounter for screening for other disorder: Secondary | ICD-10-CM | POA: Diagnosis not present

## 2020-05-08 DIAGNOSIS — Z1331 Encounter for screening for depression: Secondary | ICD-10-CM | POA: Diagnosis not present

## 2020-05-08 DIAGNOSIS — K219 Gastro-esophageal reflux disease without esophagitis: Secondary | ICD-10-CM | POA: Diagnosis not present

## 2020-05-10 DIAGNOSIS — Z79899 Other long term (current) drug therapy: Secondary | ICD-10-CM | POA: Diagnosis not present

## 2020-05-10 DIAGNOSIS — E785 Hyperlipidemia, unspecified: Secondary | ICD-10-CM | POA: Diagnosis not present

## 2020-05-10 DIAGNOSIS — Z0001 Encounter for general adult medical examination with abnormal findings: Secondary | ICD-10-CM | POA: Diagnosis not present

## 2020-07-08 DIAGNOSIS — K219 Gastro-esophageal reflux disease without esophagitis: Secondary | ICD-10-CM | POA: Diagnosis not present

## 2020-07-08 DIAGNOSIS — E785 Hyperlipidemia, unspecified: Secondary | ICD-10-CM | POA: Diagnosis not present

## 2020-07-12 DIAGNOSIS — M069 Rheumatoid arthritis, unspecified: Secondary | ICD-10-CM | POA: Diagnosis not present

## 2020-08-08 DIAGNOSIS — K219 Gastro-esophageal reflux disease without esophagitis: Secondary | ICD-10-CM | POA: Diagnosis not present

## 2020-08-08 DIAGNOSIS — M47816 Spondylosis without myelopathy or radiculopathy, lumbar region: Secondary | ICD-10-CM | POA: Diagnosis not present

## 2020-08-08 DIAGNOSIS — E785 Hyperlipidemia, unspecified: Secondary | ICD-10-CM | POA: Diagnosis not present

## 2020-09-05 DIAGNOSIS — K219 Gastro-esophageal reflux disease without esophagitis: Secondary | ICD-10-CM | POA: Diagnosis not present

## 2020-09-05 DIAGNOSIS — E785 Hyperlipidemia, unspecified: Secondary | ICD-10-CM | POA: Diagnosis not present

## 2020-10-06 DIAGNOSIS — E785 Hyperlipidemia, unspecified: Secondary | ICD-10-CM | POA: Diagnosis not present

## 2020-10-06 DIAGNOSIS — K219 Gastro-esophageal reflux disease without esophagitis: Secondary | ICD-10-CM | POA: Diagnosis not present

## 2020-11-13 DIAGNOSIS — J309 Allergic rhinitis, unspecified: Secondary | ICD-10-CM | POA: Diagnosis not present

## 2020-11-13 DIAGNOSIS — K219 Gastro-esophageal reflux disease without esophagitis: Secondary | ICD-10-CM | POA: Diagnosis not present

## 2020-11-13 DIAGNOSIS — G8929 Other chronic pain: Secondary | ICD-10-CM | POA: Diagnosis not present

## 2020-11-13 DIAGNOSIS — E785 Hyperlipidemia, unspecified: Secondary | ICD-10-CM | POA: Diagnosis not present

## 2020-12-03 DIAGNOSIS — G894 Chronic pain syndrome: Secondary | ICD-10-CM | POA: Diagnosis not present

## 2020-12-03 DIAGNOSIS — M47816 Spondylosis without myelopathy or radiculopathy, lumbar region: Secondary | ICD-10-CM | POA: Diagnosis not present

## 2020-12-03 DIAGNOSIS — M2550 Pain in unspecified joint: Secondary | ICD-10-CM | POA: Diagnosis not present

## 2020-12-03 DIAGNOSIS — M5459 Other low back pain: Secondary | ICD-10-CM | POA: Diagnosis not present

## 2020-12-14 DIAGNOSIS — K219 Gastro-esophageal reflux disease without esophagitis: Secondary | ICD-10-CM | POA: Diagnosis not present

## 2020-12-14 DIAGNOSIS — E785 Hyperlipidemia, unspecified: Secondary | ICD-10-CM | POA: Diagnosis not present

## 2021-02-13 DIAGNOSIS — G8929 Other chronic pain: Secondary | ICD-10-CM | POA: Diagnosis not present

## 2021-02-13 DIAGNOSIS — E785 Hyperlipidemia, unspecified: Secondary | ICD-10-CM | POA: Diagnosis not present

## 2021-03-04 DIAGNOSIS — M47816 Spondylosis without myelopathy or radiculopathy, lumbar region: Secondary | ICD-10-CM | POA: Diagnosis not present

## 2021-03-04 DIAGNOSIS — Z79891 Long term (current) use of opiate analgesic: Secondary | ICD-10-CM | POA: Diagnosis not present

## 2021-03-16 DIAGNOSIS — E785 Hyperlipidemia, unspecified: Secondary | ICD-10-CM | POA: Diagnosis not present

## 2021-03-16 DIAGNOSIS — G8929 Other chronic pain: Secondary | ICD-10-CM | POA: Diagnosis not present

## 2021-04-11 DIAGNOSIS — H524 Presbyopia: Secondary | ICD-10-CM | POA: Diagnosis not present

## 2021-04-15 DIAGNOSIS — G8929 Other chronic pain: Secondary | ICD-10-CM | POA: Diagnosis not present

## 2021-04-15 DIAGNOSIS — E785 Hyperlipidemia, unspecified: Secondary | ICD-10-CM | POA: Diagnosis not present

## 2021-05-05 DIAGNOSIS — E785 Hyperlipidemia, unspecified: Secondary | ICD-10-CM | POA: Diagnosis not present

## 2021-05-26 DIAGNOSIS — Z1389 Encounter for screening for other disorder: Secondary | ICD-10-CM | POA: Diagnosis not present

## 2021-05-26 DIAGNOSIS — Z1331 Encounter for screening for depression: Secondary | ICD-10-CM | POA: Diagnosis not present

## 2021-05-26 DIAGNOSIS — E785 Hyperlipidemia, unspecified: Secondary | ICD-10-CM | POA: Diagnosis not present

## 2021-05-26 DIAGNOSIS — K219 Gastro-esophageal reflux disease without esophagitis: Secondary | ICD-10-CM | POA: Diagnosis not present

## 2021-05-26 DIAGNOSIS — M549 Dorsalgia, unspecified: Secondary | ICD-10-CM | POA: Diagnosis not present

## 2021-05-26 DIAGNOSIS — Z0001 Encounter for general adult medical examination with abnormal findings: Secondary | ICD-10-CM | POA: Diagnosis not present

## 2021-06-25 DIAGNOSIS — E785 Hyperlipidemia, unspecified: Secondary | ICD-10-CM | POA: Diagnosis not present

## 2021-06-25 DIAGNOSIS — K219 Gastro-esophageal reflux disease without esophagitis: Secondary | ICD-10-CM | POA: Diagnosis not present

## 2021-07-03 DIAGNOSIS — G894 Chronic pain syndrome: Secondary | ICD-10-CM | POA: Diagnosis not present

## 2021-07-03 DIAGNOSIS — M47816 Spondylosis without myelopathy or radiculopathy, lumbar region: Secondary | ICD-10-CM | POA: Diagnosis not present

## 2021-07-03 DIAGNOSIS — Z79891 Long term (current) use of opiate analgesic: Secondary | ICD-10-CM | POA: Diagnosis not present

## 2021-07-26 DIAGNOSIS — M549 Dorsalgia, unspecified: Secondary | ICD-10-CM | POA: Diagnosis not present

## 2021-07-26 DIAGNOSIS — E785 Hyperlipidemia, unspecified: Secondary | ICD-10-CM | POA: Diagnosis not present

## 2021-08-26 DIAGNOSIS — M549 Dorsalgia, unspecified: Secondary | ICD-10-CM | POA: Diagnosis not present

## 2021-08-26 DIAGNOSIS — F419 Anxiety disorder, unspecified: Secondary | ICD-10-CM | POA: Diagnosis not present

## 2021-08-26 DIAGNOSIS — G8929 Other chronic pain: Secondary | ICD-10-CM | POA: Diagnosis not present

## 2021-09-23 DIAGNOSIS — G8929 Other chronic pain: Secondary | ICD-10-CM | POA: Diagnosis not present

## 2021-09-23 DIAGNOSIS — E785 Hyperlipidemia, unspecified: Secondary | ICD-10-CM | POA: Diagnosis not present

## 2021-10-10 DIAGNOSIS — G894 Chronic pain syndrome: Secondary | ICD-10-CM | POA: Diagnosis not present

## 2021-10-10 DIAGNOSIS — M47816 Spondylosis without myelopathy or radiculopathy, lumbar region: Secondary | ICD-10-CM | POA: Diagnosis not present

## 2021-10-10 DIAGNOSIS — M5459 Other low back pain: Secondary | ICD-10-CM | POA: Diagnosis not present

## 2021-10-10 DIAGNOSIS — Z79891 Long term (current) use of opiate analgesic: Secondary | ICD-10-CM | POA: Diagnosis not present

## 2021-10-24 DIAGNOSIS — K219 Gastro-esophageal reflux disease without esophagitis: Secondary | ICD-10-CM | POA: Diagnosis not present

## 2021-10-24 DIAGNOSIS — M549 Dorsalgia, unspecified: Secondary | ICD-10-CM | POA: Diagnosis not present

## 2021-11-23 DIAGNOSIS — E785 Hyperlipidemia, unspecified: Secondary | ICD-10-CM | POA: Diagnosis not present

## 2021-11-23 DIAGNOSIS — K219 Gastro-esophageal reflux disease without esophagitis: Secondary | ICD-10-CM | POA: Diagnosis not present

## 2022-01-20 DIAGNOSIS — K219 Gastro-esophageal reflux disease without esophagitis: Secondary | ICD-10-CM | POA: Diagnosis not present

## 2022-01-20 DIAGNOSIS — G8929 Other chronic pain: Secondary | ICD-10-CM | POA: Diagnosis not present

## 2022-01-20 DIAGNOSIS — M549 Dorsalgia, unspecified: Secondary | ICD-10-CM | POA: Diagnosis not present

## 2022-01-20 DIAGNOSIS — E785 Hyperlipidemia, unspecified: Secondary | ICD-10-CM | POA: Diagnosis not present

## 2022-01-21 DIAGNOSIS — E1159 Type 2 diabetes mellitus with other circulatory complications: Secondary | ICD-10-CM | POA: Diagnosis not present

## 2022-01-21 DIAGNOSIS — E785 Hyperlipidemia, unspecified: Secondary | ICD-10-CM | POA: Diagnosis not present

## 2022-01-21 DIAGNOSIS — Z0001 Encounter for general adult medical examination with abnormal findings: Secondary | ICD-10-CM | POA: Diagnosis not present

## 2022-01-21 DIAGNOSIS — F1920 Other psychoactive substance dependence, uncomplicated: Secondary | ICD-10-CM | POA: Diagnosis not present

## 2022-02-02 DIAGNOSIS — M5459 Other low back pain: Secondary | ICD-10-CM | POA: Diagnosis not present

## 2022-02-02 DIAGNOSIS — Z79891 Long term (current) use of opiate analgesic: Secondary | ICD-10-CM | POA: Diagnosis not present

## 2022-02-02 DIAGNOSIS — M47816 Spondylosis without myelopathy or radiculopathy, lumbar region: Secondary | ICD-10-CM | POA: Diagnosis not present

## 2022-02-02 DIAGNOSIS — M533 Sacrococcygeal disorders, not elsewhere classified: Secondary | ICD-10-CM | POA: Diagnosis not present

## 2022-02-20 DIAGNOSIS — M549 Dorsalgia, unspecified: Secondary | ICD-10-CM | POA: Diagnosis not present

## 2022-02-20 DIAGNOSIS — E785 Hyperlipidemia, unspecified: Secondary | ICD-10-CM | POA: Diagnosis not present

## 2022-03-23 DIAGNOSIS — E785 Hyperlipidemia, unspecified: Secondary | ICD-10-CM | POA: Diagnosis not present

## 2022-03-23 DIAGNOSIS — M549 Dorsalgia, unspecified: Secondary | ICD-10-CM | POA: Diagnosis not present

## 2022-04-22 DIAGNOSIS — M549 Dorsalgia, unspecified: Secondary | ICD-10-CM | POA: Diagnosis not present

## 2022-04-22 DIAGNOSIS — E785 Hyperlipidemia, unspecified: Secondary | ICD-10-CM | POA: Diagnosis not present

## 2022-05-23 DIAGNOSIS — K219 Gastro-esophageal reflux disease without esophagitis: Secondary | ICD-10-CM | POA: Diagnosis not present

## 2022-05-23 DIAGNOSIS — E785 Hyperlipidemia, unspecified: Secondary | ICD-10-CM | POA: Diagnosis not present

## 2022-06-25 DIAGNOSIS — M549 Dorsalgia, unspecified: Secondary | ICD-10-CM | POA: Diagnosis not present

## 2022-06-25 DIAGNOSIS — K219 Gastro-esophageal reflux disease without esophagitis: Secondary | ICD-10-CM | POA: Diagnosis not present

## 2022-06-25 DIAGNOSIS — Z1389 Encounter for screening for other disorder: Secondary | ICD-10-CM | POA: Diagnosis not present

## 2022-06-25 DIAGNOSIS — Z1331 Encounter for screening for depression: Secondary | ICD-10-CM | POA: Diagnosis not present

## 2022-06-25 DIAGNOSIS — E785 Hyperlipidemia, unspecified: Secondary | ICD-10-CM | POA: Diagnosis not present

## 2022-06-25 DIAGNOSIS — Z0001 Encounter for general adult medical examination with abnormal findings: Secondary | ICD-10-CM | POA: Diagnosis not present

## 2022-06-30 DIAGNOSIS — Z0001 Encounter for general adult medical examination with abnormal findings: Secondary | ICD-10-CM | POA: Diagnosis not present

## 2022-06-30 DIAGNOSIS — E785 Hyperlipidemia, unspecified: Secondary | ICD-10-CM | POA: Diagnosis not present

## 2022-07-20 DIAGNOSIS — Z79891 Long term (current) use of opiate analgesic: Secondary | ICD-10-CM | POA: Diagnosis not present

## 2022-07-20 DIAGNOSIS — M5459 Other low back pain: Secondary | ICD-10-CM | POA: Diagnosis not present

## 2022-07-26 DIAGNOSIS — K219 Gastro-esophageal reflux disease without esophagitis: Secondary | ICD-10-CM | POA: Diagnosis not present

## 2022-07-26 DIAGNOSIS — E785 Hyperlipidemia, unspecified: Secondary | ICD-10-CM | POA: Diagnosis not present

## 2022-08-14 DIAGNOSIS — G4700 Insomnia, unspecified: Secondary | ICD-10-CM | POA: Diagnosis not present

## 2022-08-14 DIAGNOSIS — K5900 Constipation, unspecified: Secondary | ICD-10-CM | POA: Diagnosis not present

## 2022-08-14 DIAGNOSIS — K219 Gastro-esophageal reflux disease without esophagitis: Secondary | ICD-10-CM | POA: Diagnosis not present

## 2022-09-14 DIAGNOSIS — E785 Hyperlipidemia, unspecified: Secondary | ICD-10-CM | POA: Diagnosis not present

## 2022-09-14 DIAGNOSIS — K219 Gastro-esophageal reflux disease without esophagitis: Secondary | ICD-10-CM | POA: Diagnosis not present

## 2022-10-15 DIAGNOSIS — K219 Gastro-esophageal reflux disease without esophagitis: Secondary | ICD-10-CM | POA: Diagnosis not present

## 2022-10-15 DIAGNOSIS — E785 Hyperlipidemia, unspecified: Secondary | ICD-10-CM | POA: Diagnosis not present

## 2022-11-11 DIAGNOSIS — Z79891 Long term (current) use of opiate analgesic: Secondary | ICD-10-CM | POA: Diagnosis not present

## 2022-11-11 DIAGNOSIS — M5459 Other low back pain: Secondary | ICD-10-CM | POA: Diagnosis not present

## 2022-11-14 DIAGNOSIS — K219 Gastro-esophageal reflux disease without esophagitis: Secondary | ICD-10-CM | POA: Diagnosis not present

## 2022-11-14 DIAGNOSIS — E785 Hyperlipidemia, unspecified: Secondary | ICD-10-CM | POA: Diagnosis not present

## 2022-12-15 DIAGNOSIS — E785 Hyperlipidemia, unspecified: Secondary | ICD-10-CM | POA: Diagnosis not present

## 2022-12-15 DIAGNOSIS — K219 Gastro-esophageal reflux disease without esophagitis: Secondary | ICD-10-CM | POA: Diagnosis not present

## 2023-01-14 DIAGNOSIS — D229 Melanocytic nevi, unspecified: Secondary | ICD-10-CM | POA: Diagnosis not present

## 2023-01-14 DIAGNOSIS — K219 Gastro-esophageal reflux disease without esophagitis: Secondary | ICD-10-CM | POA: Diagnosis not present

## 2023-01-14 DIAGNOSIS — E785 Hyperlipidemia, unspecified: Secondary | ICD-10-CM | POA: Diagnosis not present

## 2023-01-14 DIAGNOSIS — G8929 Other chronic pain: Secondary | ICD-10-CM | POA: Diagnosis not present

## 2023-02-14 DIAGNOSIS — K219 Gastro-esophageal reflux disease without esophagitis: Secondary | ICD-10-CM | POA: Diagnosis not present

## 2023-02-14 DIAGNOSIS — E785 Hyperlipidemia, unspecified: Secondary | ICD-10-CM | POA: Diagnosis not present

## 2023-03-17 DIAGNOSIS — K219 Gastro-esophageal reflux disease without esophagitis: Secondary | ICD-10-CM | POA: Diagnosis not present

## 2023-03-17 DIAGNOSIS — E785 Hyperlipidemia, unspecified: Secondary | ICD-10-CM | POA: Diagnosis not present

## 2023-04-16 DIAGNOSIS — K219 Gastro-esophageal reflux disease without esophagitis: Secondary | ICD-10-CM | POA: Diagnosis not present

## 2023-04-16 DIAGNOSIS — E785 Hyperlipidemia, unspecified: Secondary | ICD-10-CM | POA: Diagnosis not present

## 2023-05-17 DIAGNOSIS — E785 Hyperlipidemia, unspecified: Secondary | ICD-10-CM | POA: Diagnosis not present

## 2023-05-17 DIAGNOSIS — K219 Gastro-esophageal reflux disease without esophagitis: Secondary | ICD-10-CM | POA: Diagnosis not present

## 2023-06-09 DIAGNOSIS — M47816 Spondylosis without myelopathy or radiculopathy, lumbar region: Secondary | ICD-10-CM | POA: Diagnosis not present

## 2023-06-09 DIAGNOSIS — M5459 Other low back pain: Secondary | ICD-10-CM | POA: Diagnosis not present

## 2023-06-09 DIAGNOSIS — G894 Chronic pain syndrome: Secondary | ICD-10-CM | POA: Diagnosis not present

## 2023-06-09 DIAGNOSIS — Z79891 Long term (current) use of opiate analgesic: Secondary | ICD-10-CM | POA: Diagnosis not present

## 2023-06-16 DIAGNOSIS — E785 Hyperlipidemia, unspecified: Secondary | ICD-10-CM | POA: Diagnosis not present

## 2023-06-16 DIAGNOSIS — K219 Gastro-esophageal reflux disease without esophagitis: Secondary | ICD-10-CM | POA: Diagnosis not present

## 2023-07-13 DIAGNOSIS — M549 Dorsalgia, unspecified: Secondary | ICD-10-CM | POA: Diagnosis not present

## 2023-07-13 DIAGNOSIS — E785 Hyperlipidemia, unspecified: Secondary | ICD-10-CM | POA: Diagnosis not present

## 2023-07-13 DIAGNOSIS — M158 Other polyosteoarthritis: Secondary | ICD-10-CM | POA: Diagnosis not present

## 2023-07-13 DIAGNOSIS — Z1389 Encounter for screening for other disorder: Secondary | ICD-10-CM | POA: Diagnosis not present

## 2023-07-13 DIAGNOSIS — K219 Gastro-esophageal reflux disease without esophagitis: Secondary | ICD-10-CM | POA: Diagnosis not present

## 2023-07-13 DIAGNOSIS — Z1331 Encounter for screening for depression: Secondary | ICD-10-CM | POA: Diagnosis not present

## 2023-07-13 DIAGNOSIS — Z0001 Encounter for general adult medical examination with abnormal findings: Secondary | ICD-10-CM | POA: Diagnosis not present

## 2023-07-28 DIAGNOSIS — Z1159 Encounter for screening for other viral diseases: Secondary | ICD-10-CM | POA: Diagnosis not present

## 2023-07-28 DIAGNOSIS — F1920 Other psychoactive substance dependence, uncomplicated: Secondary | ICD-10-CM | POA: Diagnosis not present

## 2023-07-28 DIAGNOSIS — Z0001 Encounter for general adult medical examination with abnormal findings: Secondary | ICD-10-CM | POA: Diagnosis not present

## 2023-07-28 DIAGNOSIS — E785 Hyperlipidemia, unspecified: Secondary | ICD-10-CM | POA: Diagnosis not present

## 2023-07-28 DIAGNOSIS — Z125 Encounter for screening for malignant neoplasm of prostate: Secondary | ICD-10-CM | POA: Diagnosis not present

## 2023-08-13 DIAGNOSIS — K219 Gastro-esophageal reflux disease without esophagitis: Secondary | ICD-10-CM | POA: Diagnosis not present

## 2023-08-13 DIAGNOSIS — E785 Hyperlipidemia, unspecified: Secondary | ICD-10-CM | POA: Diagnosis not present

## 2023-09-10 DIAGNOSIS — Z79891 Long term (current) use of opiate analgesic: Secondary | ICD-10-CM | POA: Diagnosis not present

## 2023-09-10 DIAGNOSIS — M5459 Other low back pain: Secondary | ICD-10-CM | POA: Diagnosis not present

## 2023-09-10 DIAGNOSIS — E785 Hyperlipidemia, unspecified: Secondary | ICD-10-CM | POA: Diagnosis not present

## 2023-09-10 DIAGNOSIS — K219 Gastro-esophageal reflux disease without esophagitis: Secondary | ICD-10-CM | POA: Diagnosis not present

## 2023-09-10 DIAGNOSIS — M47816 Spondylosis without myelopathy or radiculopathy, lumbar region: Secondary | ICD-10-CM | POA: Diagnosis not present

## 2023-09-10 DIAGNOSIS — F1120 Opioid dependence, uncomplicated: Secondary | ICD-10-CM | POA: Diagnosis not present

## 2023-09-10 DIAGNOSIS — G894 Chronic pain syndrome: Secondary | ICD-10-CM | POA: Diagnosis not present

## 2023-10-04 DIAGNOSIS — M47816 Spondylosis without myelopathy or radiculopathy, lumbar region: Secondary | ICD-10-CM | POA: Diagnosis not present

## 2023-10-04 DIAGNOSIS — F1120 Opioid dependence, uncomplicated: Secondary | ICD-10-CM | POA: Diagnosis not present

## 2023-10-04 DIAGNOSIS — M5459 Other low back pain: Secondary | ICD-10-CM | POA: Diagnosis not present

## 2023-10-04 DIAGNOSIS — Z79891 Long term (current) use of opiate analgesic: Secondary | ICD-10-CM | POA: Diagnosis not present

## 2023-10-11 DIAGNOSIS — K219 Gastro-esophageal reflux disease without esophagitis: Secondary | ICD-10-CM | POA: Diagnosis not present

## 2023-10-11 DIAGNOSIS — E785 Hyperlipidemia, unspecified: Secondary | ICD-10-CM | POA: Diagnosis not present

## 2023-11-10 DIAGNOSIS — E785 Hyperlipidemia, unspecified: Secondary | ICD-10-CM | POA: Diagnosis not present

## 2023-11-10 DIAGNOSIS — K219 Gastro-esophageal reflux disease without esophagitis: Secondary | ICD-10-CM | POA: Diagnosis not present

## 2023-12-11 DIAGNOSIS — K219 Gastro-esophageal reflux disease without esophagitis: Secondary | ICD-10-CM | POA: Diagnosis not present

## 2023-12-11 DIAGNOSIS — E785 Hyperlipidemia, unspecified: Secondary | ICD-10-CM | POA: Diagnosis not present

## 2023-12-13 DIAGNOSIS — M5459 Other low back pain: Secondary | ICD-10-CM | POA: Diagnosis not present

## 2023-12-13 DIAGNOSIS — G894 Chronic pain syndrome: Secondary | ICD-10-CM | POA: Diagnosis not present

## 2023-12-13 DIAGNOSIS — F1120 Opioid dependence, uncomplicated: Secondary | ICD-10-CM | POA: Diagnosis not present

## 2023-12-13 DIAGNOSIS — M47816 Spondylosis without myelopathy or radiculopathy, lumbar region: Secondary | ICD-10-CM | POA: Diagnosis not present

## 2023-12-27 DIAGNOSIS — M129 Arthropathy, unspecified: Secondary | ICD-10-CM | POA: Diagnosis not present

## 2023-12-27 DIAGNOSIS — Z79899 Other long term (current) drug therapy: Secondary | ICD-10-CM | POA: Diagnosis not present

## 2023-12-27 DIAGNOSIS — R300 Dysuria: Secondary | ICD-10-CM | POA: Diagnosis not present

## 2023-12-27 DIAGNOSIS — M47816 Spondylosis without myelopathy or radiculopathy, lumbar region: Secondary | ICD-10-CM | POA: Diagnosis not present

## 2024-01-04 DIAGNOSIS — Z79899 Other long term (current) drug therapy: Secondary | ICD-10-CM | POA: Diagnosis not present

## 2024-01-04 DIAGNOSIS — G8929 Other chronic pain: Secondary | ICD-10-CM | POA: Diagnosis not present

## 2024-01-04 DIAGNOSIS — M545 Low back pain, unspecified: Secondary | ICD-10-CM | POA: Diagnosis not present

## 2024-01-12 DIAGNOSIS — K219 Gastro-esophageal reflux disease without esophagitis: Secondary | ICD-10-CM | POA: Diagnosis not present

## 2024-01-12 DIAGNOSIS — E785 Hyperlipidemia, unspecified: Secondary | ICD-10-CM | POA: Diagnosis not present

## 2024-01-12 DIAGNOSIS — M158 Other polyosteoarthritis: Secondary | ICD-10-CM | POA: Diagnosis not present

## 2024-01-12 DIAGNOSIS — I781 Nevus, non-neoplastic: Secondary | ICD-10-CM | POA: Diagnosis not present

## 2024-02-11 DIAGNOSIS — M545 Low back pain, unspecified: Secondary | ICD-10-CM | POA: Diagnosis not present

## 2024-02-11 DIAGNOSIS — G8929 Other chronic pain: Secondary | ICD-10-CM | POA: Diagnosis not present

## 2024-02-11 DIAGNOSIS — R030 Elevated blood-pressure reading, without diagnosis of hypertension: Secondary | ICD-10-CM | POA: Diagnosis not present

## 2024-02-11 DIAGNOSIS — Z79899 Other long term (current) drug therapy: Secondary | ICD-10-CM | POA: Diagnosis not present

## 2024-02-13 DIAGNOSIS — E785 Hyperlipidemia, unspecified: Secondary | ICD-10-CM | POA: Diagnosis not present

## 2024-02-13 DIAGNOSIS — K219 Gastro-esophageal reflux disease without esophagitis: Secondary | ICD-10-CM | POA: Diagnosis not present

## 2024-03-02 DIAGNOSIS — L821 Other seborrheic keratosis: Secondary | ICD-10-CM | POA: Diagnosis not present

## 2024-03-02 DIAGNOSIS — C44519 Basal cell carcinoma of skin of other part of trunk: Secondary | ICD-10-CM | POA: Diagnosis not present

## 2024-03-02 DIAGNOSIS — L814 Other melanin hyperpigmentation: Secondary | ICD-10-CM | POA: Diagnosis not present

## 2024-03-02 DIAGNOSIS — L573 Poikiloderma of Civatte: Secondary | ICD-10-CM | POA: Diagnosis not present

## 2024-03-02 DIAGNOSIS — D485 Neoplasm of uncertain behavior of skin: Secondary | ICD-10-CM | POA: Diagnosis not present

## 2024-03-13 DIAGNOSIS — M545 Low back pain, unspecified: Secondary | ICD-10-CM | POA: Diagnosis not present

## 2024-03-13 DIAGNOSIS — Z79899 Other long term (current) drug therapy: Secondary | ICD-10-CM | POA: Diagnosis not present

## 2024-03-13 DIAGNOSIS — G8929 Other chronic pain: Secondary | ICD-10-CM | POA: Diagnosis not present

## 2024-03-15 DIAGNOSIS — K219 Gastro-esophageal reflux disease without esophagitis: Secondary | ICD-10-CM | POA: Diagnosis not present

## 2024-03-15 DIAGNOSIS — C44619 Basal cell carcinoma of skin of left upper limb, including shoulder: Secondary | ICD-10-CM | POA: Diagnosis not present

## 2024-03-15 DIAGNOSIS — E785 Hyperlipidemia, unspecified: Secondary | ICD-10-CM | POA: Diagnosis not present

## 2024-04-12 DIAGNOSIS — Z79899 Other long term (current) drug therapy: Secondary | ICD-10-CM | POA: Diagnosis not present

## 2024-04-12 DIAGNOSIS — M545 Low back pain, unspecified: Secondary | ICD-10-CM | POA: Diagnosis not present

## 2024-04-12 DIAGNOSIS — R030 Elevated blood-pressure reading, without diagnosis of hypertension: Secondary | ICD-10-CM | POA: Diagnosis not present

## 2024-04-14 DIAGNOSIS — K219 Gastro-esophageal reflux disease without esophagitis: Secondary | ICD-10-CM | POA: Diagnosis not present

## 2024-04-14 DIAGNOSIS — E785 Hyperlipidemia, unspecified: Secondary | ICD-10-CM | POA: Diagnosis not present

## 2024-05-12 DIAGNOSIS — R030 Elevated blood-pressure reading, without diagnosis of hypertension: Secondary | ICD-10-CM | POA: Diagnosis not present

## 2024-05-12 DIAGNOSIS — Z79899 Other long term (current) drug therapy: Secondary | ICD-10-CM | POA: Diagnosis not present

## 2024-05-12 DIAGNOSIS — G8929 Other chronic pain: Secondary | ICD-10-CM | POA: Diagnosis not present

## 2024-05-23 DIAGNOSIS — E785 Hyperlipidemia, unspecified: Secondary | ICD-10-CM | POA: Diagnosis not present

## 2024-05-23 DIAGNOSIS — K219 Gastro-esophageal reflux disease without esophagitis: Secondary | ICD-10-CM | POA: Diagnosis not present

## 2024-05-23 DIAGNOSIS — J309 Allergic rhinitis, unspecified: Secondary | ICD-10-CM | POA: Diagnosis not present
